# Patient Record
Sex: Male | Born: 1935 | Race: White | Hispanic: No | Smoking: Former smoker
Health system: Southern US, Community
[De-identification: ages and names within clinical notes are randomized; demographics above are authoritative.]

## PROBLEM LIST (undated history)

## (undated) DIAGNOSIS — M1990 Unspecified osteoarthritis, unspecified site: Secondary | ICD-10-CM

## (undated) DIAGNOSIS — M419 Scoliosis, unspecified: Secondary | ICD-10-CM

## (undated) DIAGNOSIS — I272 Pulmonary hypertension, unspecified: Secondary | ICD-10-CM

## (undated) DIAGNOSIS — I10 Essential (primary) hypertension: Secondary | ICD-10-CM

## (undated) DIAGNOSIS — M545 Low back pain, unspecified: Secondary | ICD-10-CM

## (undated) DIAGNOSIS — K649 Unspecified hemorrhoids: Secondary | ICD-10-CM

## (undated) DIAGNOSIS — C4491 Basal cell carcinoma of skin, unspecified: Secondary | ICD-10-CM

## (undated) DIAGNOSIS — G4733 Obstructive sleep apnea (adult) (pediatric): Secondary | ICD-10-CM

## (undated) DIAGNOSIS — IMO0002 Reserved for concepts with insufficient information to code with codable children: Secondary | ICD-10-CM

## (undated) DIAGNOSIS — R011 Cardiac murmur, unspecified: Secondary | ICD-10-CM

## (undated) DIAGNOSIS — I771 Stricture of artery: Secondary | ICD-10-CM

## (undated) DIAGNOSIS — Z9981 Dependence on supplemental oxygen: Secondary | ICD-10-CM

## (undated) DIAGNOSIS — I499 Cardiac arrhythmia, unspecified: Secondary | ICD-10-CM

## (undated) DIAGNOSIS — K5790 Diverticulosis of intestine, part unspecified, without perforation or abscess without bleeding: Secondary | ICD-10-CM

## (undated) DIAGNOSIS — I6523 Occlusion and stenosis of bilateral carotid arteries: Secondary | ICD-10-CM

## (undated) DIAGNOSIS — Z9989 Dependence on other enabling machines and devices: Secondary | ICD-10-CM

## (undated) DIAGNOSIS — G8929 Other chronic pain: Secondary | ICD-10-CM

## (undated) DIAGNOSIS — C439 Malignant melanoma of skin, unspecified: Secondary | ICD-10-CM

## (undated) DIAGNOSIS — R519 Headache, unspecified: Secondary | ICD-10-CM

## (undated) DIAGNOSIS — E785 Hyperlipidemia, unspecified: Secondary | ICD-10-CM

## (undated) DIAGNOSIS — G479 Sleep disorder, unspecified: Secondary | ICD-10-CM

## (undated) DIAGNOSIS — R51 Headache: Secondary | ICD-10-CM

## (undated) DIAGNOSIS — N400 Enlarged prostate without lower urinary tract symptoms: Secondary | ICD-10-CM

## (undated) HISTORY — PX: CERVICAL DISC SURGERY: SHX588

## (undated) HISTORY — PX: APPENDECTOMY: SHX54

## (undated) HISTORY — DX: Cardiac arrhythmia, unspecified: I49.9

## (undated) HISTORY — DX: Scoliosis, unspecified: M41.9

## (undated) HISTORY — DX: Unspecified hemorrhoids: K64.9

## (undated) HISTORY — DX: Essential (primary) hypertension: I10

## (undated) HISTORY — DX: Hyperlipidemia, unspecified: E78.5

## (undated) HISTORY — DX: Pulmonary hypertension, unspecified: I27.20

## (undated) HISTORY — DX: Sleep disorder, unspecified: G47.9

## (undated) HISTORY — PX: CATARACT EXTRACTION, BILATERAL: SHX1313

## (undated) HISTORY — PX: TONSILLECTOMY: SUR1361

## (undated) HISTORY — PX: INGUINAL HERNIA REPAIR: SUR1180

## (undated) HISTORY — DX: Benign prostatic hyperplasia without lower urinary tract symptoms: N40.0

## (undated) HISTORY — DX: Stricture of artery: I77.1

## (undated) HISTORY — DX: Occlusion and stenosis of bilateral carotid arteries: I65.23

## (undated) HISTORY — DX: Diverticulosis of intestine, part unspecified, without perforation or abscess without bleeding: K57.90

## (undated) HISTORY — PX: SKIN CANCER EXCISION: SHX779

## (undated) HISTORY — PX: BACK SURGERY: SHX140

---

## 1998-10-02 ENCOUNTER — Ambulatory Visit (HOSPITAL_COMMUNITY): Admission: RE | Admit: 1998-10-02 | Discharge: 1998-10-02 | Payer: Self-pay | Admitting: Neurosurgery

## 1998-10-03 ENCOUNTER — Encounter: Payer: Self-pay | Admitting: Neurosurgery

## 1998-10-12 ENCOUNTER — Encounter: Payer: Self-pay | Admitting: Neurosurgery

## 1998-10-14 ENCOUNTER — Inpatient Hospital Stay (HOSPITAL_COMMUNITY): Admission: RE | Admit: 1998-10-14 | Discharge: 1998-10-15 | Payer: Self-pay | Admitting: Neurosurgery

## 1998-10-14 ENCOUNTER — Encounter: Payer: Self-pay | Admitting: Neurosurgery

## 2000-04-14 ENCOUNTER — Emergency Department (HOSPITAL_COMMUNITY): Admission: EM | Admit: 2000-04-14 | Discharge: 2000-04-14 | Payer: Self-pay | Admitting: Emergency Medicine

## 2000-04-14 ENCOUNTER — Encounter: Payer: Self-pay | Admitting: Emergency Medicine

## 2002-02-18 ENCOUNTER — Encounter: Payer: Self-pay | Admitting: Critical Care Medicine

## 2002-02-18 ENCOUNTER — Ambulatory Visit (HOSPITAL_COMMUNITY): Admission: RE | Admit: 2002-02-18 | Discharge: 2002-02-18 | Payer: Self-pay | Admitting: Critical Care Medicine

## 2008-02-04 DIAGNOSIS — I6523 Occlusion and stenosis of bilateral carotid arteries: Secondary | ICD-10-CM

## 2008-02-04 HISTORY — DX: Occlusion and stenosis of bilateral carotid arteries: I65.23

## 2012-07-18 ENCOUNTER — Ambulatory Visit (INDEPENDENT_AMBULATORY_CARE_PROVIDER_SITE_OTHER): Payer: Self-pay | Admitting: General Surgery

## 2012-07-31 ENCOUNTER — Encounter (INDEPENDENT_AMBULATORY_CARE_PROVIDER_SITE_OTHER): Payer: Self-pay

## 2012-08-09 ENCOUNTER — Ambulatory Visit (INDEPENDENT_AMBULATORY_CARE_PROVIDER_SITE_OTHER): Payer: Medicare Other | Admitting: General Surgery

## 2012-08-21 ENCOUNTER — Ambulatory Visit (INDEPENDENT_AMBULATORY_CARE_PROVIDER_SITE_OTHER): Payer: Medicare Other | Admitting: General Surgery

## 2012-08-21 ENCOUNTER — Encounter (INDEPENDENT_AMBULATORY_CARE_PROVIDER_SITE_OTHER): Payer: Self-pay | Admitting: General Surgery

## 2012-08-21 VITALS — BP 124/64 | HR 70 | Temp 99.0°F | Resp 18 | Ht 66.0 in | Wt 150.2 lb

## 2012-08-21 NOTE — Progress Notes (Signed)
Chief Complaint  Patient presents with  . New Evaluation    eval hems    HISTORY: Pedro Hall is a 79 y.o. male who presents to the office with prolapsing hemorrhoids.  He states that he has to manually reduce them after BM's.  Other symptoms include minor bleeding.  This had been occurring for about a year.  He has tried nothing in the past. His bowel habits are regular and his bowel movements are usually soft.  His fiber intake is moderate.  His last colonoscopy was in 2007 and showed a diveticular striture.  He does have prolapsing tissue.     Past Medical History  Diagnosis Date  . Hypertension   . Stenosis of right subclavian artery   . BPH (benign prostatic hypertrophy)   . Arrhythmia   . Hyperlipidemia   . Diverticulosis   . Severe scoliosis   . Bilateral carotid artery stenosis 02/2008  . Hemorrhoids   . Sleep disorder   . Sleep apnea       History reviewed. No pertinent past surgical history.      Current Outpatient Prescriptions  Medication Sig Dispense Refill  . amLODipine (NORVASC) 2.5 MG tablet Take 2.5 mg by mouth daily.      Marland Kitchen aspirin 81 MG tablet Take 81 mg by mouth daily.      . Calcium Carbonate-Vit D-Min (GNP CALCIUM PLUS 600 +D) 600-200 MG-UNIT TABS Take 1 tablet by mouth daily.      . fish oil-omega-3 fatty acids 1000 MG capsule Take 1 g by mouth daily. With food      . MULTIPLE VITAMIN PO Take 1 tablet by mouth daily.      . vitamin C (ASCORBIC ACID) 500 MG tablet Take 500 mg by mouth daily.      Marland Kitchen latanoprost (XALATAN) 0.005 % ophthalmic solution       . MYRBETRIQ 25 MG TB24        No current facility-administered medications for this visit.      No Known Allergies    History reviewed. No pertinent family history.  History   Social History  . Marital Status: Widowed    Spouse Name: N/A    Number of Children: N/A  . Years of Education: N/A   Social History Main Topics  . Smoking status: Former Smoker    Types: Cigarettes    Quit  date: 06/05/1949  . Smokeless tobacco: Never Used  . Alcohol Use: 1.8 oz/week    3 Glasses of wine per week     Comment: Weekly.  . Drug Use: No  . Sexually Active: None   Other Topics Concern  . None   Social History Narrative  . None      REVIEW OF SYSTEMS - PERTINENT POSITIVES ONLY: Review of Systems - General ROS: negative for - chills, fever or weight loss Hematological and Lymphatic ROS: negative for - bleeding problems, blood clots or bruising Respiratory ROS: no cough, shortness of breath, or wheezing Cardiovascular ROS: no chest pain or dyspnea on exertion Gastrointestinal ROS: no abdominal pain, change in bowel habits, or black or bloody stools Genito-Urinary ROS: no dysuria, trouble voiding, or hematuria  EXAM: Filed Vitals:   08/21/12 0904  BP: 124/64  Pulse: 70  Temp: 99 F (37.2 C)  Resp: 18    General appearance: alert and cooperative Resp: clear to auscultation bilaterally Cardio: regular rate and rhythm GI: soft, non-tender; bowel sounds normal; no masses,  no organomegaly   Procedure:  Anoscopy Surgeon: Maisie Fus Diagnosis: Prolapsing hemorrhoids  Assistant: Christella Scheuermann After the risks and benefits were explained, verbal consent was obtained for above procedure  Anesthesia: none Findings: Grade 3 L anterior hemorrhoid, grade 2 right ant and post hemorrhoids, hard stool encountered in rectal vault    ASSESSMENT AND PLAN: Pedro Hall is a 79 y.o. M with prolapsing hemorrhoids that have to be manually reduced.  On exam it appears the L posterior hemorrhoid is the most pronounced.  He is already on a high fiber diet and gets symptoms even with flatus.  I believe that a hemorrhoidopexy would relieve his symptoms with minimal disruption to his daily routine.  The risks of the surgery are bleeding, infection, urinary retention and recurrence.  I believe he unsderstands this risks and has agreed to proceed.  We will get this scheduled in 3-4 weeks.  In the  meantime, he will work on increasing his liquid intake along with continuing his high fiber diet to help soften his BM's.    Vanita Panda, MD Colon and Rectal Surgery / General Surgery [facility]      Visit Diagnoses: 1. Hemorrhoids, internal     Primary Care Physician: Ginette Otto, MD

## 2012-08-21 NOTE — Patient Instructions (Signed)

## 2012-09-03 ENCOUNTER — Encounter (INDEPENDENT_AMBULATORY_CARE_PROVIDER_SITE_OTHER): Payer: Self-pay

## 2012-09-10 HISTORY — PX: HEMORRHOID SURGERY: SHX153

## 2012-09-11 DIAGNOSIS — K648 Other hemorrhoids: Secondary | ICD-10-CM

## 2012-09-13 ENCOUNTER — Telehealth (INDEPENDENT_AMBULATORY_CARE_PROVIDER_SITE_OTHER): Payer: Self-pay

## 2012-09-13 NOTE — Telephone Encounter (Signed)
Pt called wanting to know if a sitz bath is ok for pt to use instead of tub soaks and should he be putting any cream or ointment on rectum. I advised pt a warm water sitz bath is fine but to avoid creams or ointments until he has his first po appt. Pt states he understands. Pt to call with any concerns.

## 2012-09-25 ENCOUNTER — Encounter (INDEPENDENT_AMBULATORY_CARE_PROVIDER_SITE_OTHER): Payer: Medicare Other | Admitting: General Surgery

## 2012-10-10 ENCOUNTER — Encounter (INDEPENDENT_AMBULATORY_CARE_PROVIDER_SITE_OTHER): Payer: Self-pay | Admitting: General Surgery

## 2012-10-10 ENCOUNTER — Ambulatory Visit (INDEPENDENT_AMBULATORY_CARE_PROVIDER_SITE_OTHER): Payer: Medicare Other | Admitting: General Surgery

## 2012-10-10 VITALS — BP 164/84 | HR 74 | Temp 96.5°F | Ht 66.5 in | Wt 148.0 lb

## 2012-10-10 DIAGNOSIS — Z9889 Other specified postprocedural states: Secondary | ICD-10-CM

## 2012-10-10 NOTE — Patient Instructions (Addendum)
Follow up as needed

## 2012-10-10 NOTE — Progress Notes (Signed)
Pedro Hall is a 79 y.o. male who is status post a hemorrhoidopexy on 4/9.  He did well after surgery.  He had min pain and bleeding except for one episode about 2 weeks after surgery.  He had a large amt of blood with his BM after [place] train [place].  He has not had any problems since then.    Objective: Filed Vitals:   10/10/12 1627  BP: 164/84  Pulse: 74  Temp: 96.5 F (35.8 C)    General appearance: alert and cooperative  Incision: healing well, no significant drainage   Assessment: s/p  There are no active problems to display for this patient. mucopexy  Plan: RTO PRN    .Vanita Panda, MD [facility], [place] [phone]   10/10/2012 4:30 PM

## 2013-08-28 ENCOUNTER — Other Ambulatory Visit: Payer: Self-pay | Admitting: Geriatric Medicine

## 2013-08-28 ENCOUNTER — Ambulatory Visit
Admission: RE | Admit: 2013-08-28 | Discharge: 2013-08-28 | Disposition: A | Discharge status: Home / Self Care | Payer: Medicare Other | Source: Ambulatory Visit | Attending: Geriatric Medicine | Admitting: Geriatric Medicine

## 2013-08-28 DIAGNOSIS — E236 Other disorders of pituitary gland: Secondary | ICD-10-CM

## 2014-09-28 ENCOUNTER — Ambulatory Visit (INDEPENDENT_AMBULATORY_CARE_PROVIDER_SITE_OTHER): Payer: Medicare Other | Admitting: Family Medicine

## 2014-09-28 VITALS — BP 116/68 | HR 74 | Temp 98.3°F | Resp 17 | Ht 64.0 in | Wt 152.0 lb

## 2014-09-28 DIAGNOSIS — L03012 Cellulitis of left finger: Secondary | ICD-10-CM | POA: Diagnosis not present

## 2014-09-28 MED ORDER — AMOXICILLIN-POT CLAVULANATE 875-125 MG PO TABS: 1.0000 | ORAL_TABLET | Freq: Two times a day (BID) | ORAL | Status: DC

## 2014-09-28 NOTE — Patient Instructions (Signed)
Your last tetanus shot was in 2007. Your up-to-date. You need to take the antibiotic prescribed (it should be ready at your pharmacy) twice a day with food for the next 10 days. Please come back in one week so we can look at the finger again

## 2014-09-28 NOTE — Progress Notes (Signed)
Is an 81 year old retired gentleman who struck his left middle finger on the door when he lost his balance 10 days ago. He cut the finger but take cae of it himself. It has gotten progressively more swollen in the distal phalanx along with increasing pain. This been no discharge but he saw the nurse at the home or he stays and was advised to come here for further evaluation.  Objective:BP 116/68 mmHg  Pulse 74  Temp(Src) 98.3 F (36.8 C) (Oral)  Resp 17  Ht 5\' 4"  (1.626 m)  Wt 152 lb (68.947 kg)  BMI 26.08 kg/m2  SpO2 95%  Examination of the left middle finger reveals moderate swelling and erythema in the entire distal phalanx of the left middle finger with a dried laceration on the radial side the cuticle. He has full range of motion of the distal phalanx and there is no red streaking.   Assessment: Cellulitis following laceration and contusion of the left middle finger, distal phalanx  This chart was scribed in my presence and reviewed by me personally.    ICD-9-CM ICD-10-CM   1. Cellulitis of finger of left hand 681.00 L03.012 amoxicillin-clavulanate (AUGMENTIN) 875-125 MG per tablet     Signed, Robyn Haber, MD

## 2015-06-02 ENCOUNTER — Ambulatory Visit
Admission: RE | Admit: 2015-06-02 | Discharge: 2015-06-02 | Disposition: A | Discharge status: Home / Self Care | Payer: Medicare Other | Source: Ambulatory Visit | Attending: Geriatric Medicine | Admitting: Geriatric Medicine

## 2015-06-02 ENCOUNTER — Other Ambulatory Visit: Payer: Self-pay | Admitting: Geriatric Medicine

## 2015-06-02 DIAGNOSIS — R0600 Dyspnea, unspecified: Secondary | ICD-10-CM

## 2015-06-10 ENCOUNTER — Ambulatory Visit (INDEPENDENT_AMBULATORY_CARE_PROVIDER_SITE_OTHER): Payer: Medicare Other | Admitting: Cardiovascular Disease

## 2015-06-10 ENCOUNTER — Encounter: Payer: Self-pay | Admitting: Cardiovascular Disease

## 2015-06-10 VITALS — BP 136/76 | HR 79 | Ht 64.0 in | Wt 147.5 lb

## 2015-06-10 DIAGNOSIS — M419 Scoliosis, unspecified: Secondary | ICD-10-CM

## 2015-06-10 DIAGNOSIS — R0602 Shortness of breath: Secondary | ICD-10-CM

## 2015-06-10 DIAGNOSIS — I771 Stricture of artery: Secondary | ICD-10-CM

## 2015-06-10 DIAGNOSIS — I10 Essential (primary) hypertension: Secondary | ICD-10-CM | POA: Diagnosis not present

## 2015-06-10 DIAGNOSIS — I708 Atherosclerosis of other arteries: Secondary | ICD-10-CM

## 2015-06-10 DIAGNOSIS — E785 Hyperlipidemia, unspecified: Secondary | ICD-10-CM

## 2015-06-10 DIAGNOSIS — G4733 Obstructive sleep apnea (adult) (pediatric): Secondary | ICD-10-CM

## 2015-06-10 DIAGNOSIS — R079 Chest pain, unspecified: Secondary | ICD-10-CM

## 2015-06-10 NOTE — Patient Instructions (Signed)
Medication Instructions:  Please continue your current medications  Labwork: Your physician recommends that you return for lab work in at your earliest convenience.  Testing/Procedures: 1. Echocardiogram - Your physician has requested that you have an echocardiogram. Echocardiography is a painless test that uses sound waves to create images of your heart. It provides your doctor with information about the size and shape of your heart and how well your heart's chambers and valves are working. This procedure takes approximately one hour. There are no restrictions for this procedure.  2. Stress test - Your physician has requested that you have a lexiscan myoview. For further information please visit [email or web address]. Please follow instruction sheet, as given.  Follow-Up: Dr Oval Linsey recommends that you schedule a follow-up appointment in 3 months.  If you need a refill on your cardiac medications before your next appointment, please call your pharmacy.

## 2015-06-10 NOTE — Progress Notes (Signed)
Cardiology Office Note   Date:  06/14/2015   ID:  Pedro Hall, DOB 1923/08/02, MRN [id number]  PCP:  Mathews Argyle, MD  Cardiologist:   Sharol Harness, MD   Chief Complaint  Patient presents with  . New Evaluation    referred for shortness of breath with minimal exertion by dr Felipa Eth, otherwise, no complaints     History of Present Illness: Pedro Hall is a 82 y.o. male with hypertension, hyperlipidemia, bilateral carotid stenosis, and right subclavian stenosis who presents for an evaluation of shortness of breath.  Pedro Hall started noticing shortness of breath in December.  On Christmas day he was walking up a steep incline and had to stop and rest.  There was associated chest discomfort, nausea, vomiting or diaphoresis. He has not noticed any lower extremity edema, orthopnea or PND. He does have sleep apnea and uses his CPAP machine.  He has also noticed a nonproductive cough in the last couple of weeks. He denies any fevers or chills. However, it has been broken for the last month. Pedro Hall denies lightheadedness, dizziness, or palpitations. He saw Dr. Felipa Eth earlier this week and reported worsening shortness of breath.  Therefore, he was referred to cardiology for further evaluation.  Pedro Hall excercises daily at the fitness center for 35-45 minutes every morning.  He works out on [employer] and sometimes walks.  He denies dyspnea or chest pain or shortness of breath with this activity.  He used to walk 1 mile per day but has not been doing so lately.     Past Medical History  Diagnosis Date  . Hypertension   . Stenosis of right subclavian artery   . BPH (benign prostatic hypertrophy)   . Arrhythmia   . Hyperlipidemia   . Diverticulosis   . Severe scoliosis   . Bilateral carotid artery stenosis 02/2008  . Hemorrhoids   . Sleep disorder   . Sleep apnea   . Essential hypertension 06/14/2015  . Hyperlipidemia 06/14/2015    . Subclavian artery stenosis, right 06/14/2015  . Scoliosis 06/14/2015  . OSA (obstructive sleep apnea) 06/14/2015    Past Surgical History  Procedure Laterality Date  . Hemorrhoid surgery  09/10/12     Current Outpatient Prescriptions  Medication Sig Dispense Refill  . amLODipine (NORVASC) 2.5 MG tablet Take 2.5 mg by mouth daily.    Marland Kitchen amoxicillin-clavulanate (AUGMENTIN) 875-125 MG per tablet Take 1 tablet by mouth 2 (two) times daily. Take with food 20 tablet 0  . aspirin 81 MG tablet Take 81 mg by mouth daily.    . Calcium Carbonate-Vit D-Min (GNP CALCIUM PLUS 600 +D) 600-200 MG-UNIT TABS Take 1 tablet by mouth daily.    . fish oil-omega-3 fatty acids 1000 MG capsule Take 1 g by mouth daily. With food    . latanoprost (XALATAN) 0.005 % ophthalmic solution     . MULTIPLE VITAMIN PO Take 1 tablet by mouth daily.    Marland Kitchen MYRBETRIQ 25 MG TB24     . vitamin C (ASCORBIC ACID) 500 MG tablet Take 500 mg by mouth daily.     No current facility-administered medications for this visit.    Allergies:   Review of patient's allergies indicates no known allergies.    Social History:  The patient  reports that he quit smoking about 66 years ago. His smoking use included Cigarettes. He has never used smokeless tobacco. He reports that he drinks about 1.8 oz of alcohol per  week. He reports that he does not use illicit drugs.   Family History:  The patient's family history is not on file.    ROS:  Please see the history of present illness.   Otherwise, review of systems are positive for none.   All other systems are reviewed and negative.    PHYSICAL EXAM: VS:  BP 136/76 mmHg  Pulse 79  Ht 5\' 4"  (1.626 m)  Wt 66.906 kg (147 lb 8 oz)  BMI 25.31 kg/m2 , BMI Body mass index is 25.31 kg/(m^2). GENERAL:  Well appearing HEENT:  Pupils equal round and reactive, fundi not visualized, oral mucosa unremarkable NECK:  No jugular venous distention, waveform within normal limits, carotid upstroke brisk and  symmetric, no bruits, no thyromegaly LYMPHATICS:  No cervical adenopathy LUNGS:  Clear to auscultation bilaterally HEART:  RRR.  PMI not displaced or sustained,S1 and S2 within normal limits, no S3, no S4, no clicks, no rubs, III/VI mid-peaking crescendo-decrescendo murmur at the LUSB ABD:  Flat, positive bowel sounds normal in frequency in pitch, no bruits, no rebound, no guarding, no midline pulsatile mass, no hepatomegaly, no splenomegaly EXT:  2 plus pulses throughout, no edema, no cyanosis no clubbing SKIN:  No rashes no nodules NEURO:  Cranial nerves II through XII grossly intact, motor grossly intact throughout PSYCH:  Cognitively intact, oriented to person place and time    EKG:  EKG is ordered today. The ekg ordered today demonstrates sinus rhythm rate 79 bpm.  Peaked t waves   Recent Labs: 06/10/2015: BUN 13; Creat 0.57*; Potassium 5.0; Sodium 129*    Lipid Panel No results found for: CHOL, TRIG, HDL, CHOLHDL, VLDL, LDLCALC, LDLDIRECT    Wt Readings from Last 3 Encounters:  06/10/15 66.906 kg (147 lb 8 oz)  09/28/14 68.947 kg (152 lb)  10/10/12 67.132 kg (148 lb)      ASSESSMENT AND PLAN:  # Shortness of breath:  Pedro Hall does not have evidence of heart failure on exam, but he does have a systolic murmur concerning for aortic stenosis. It does not appear to be severe based on exam.  However, we will obtain an echo to evaluate for valvular heart disease.  We will also obtain a Lexiscan Cardiolite to evaluate for ischemia as the his dyspnea.  # Murmur: Concerning for aortic stenosis.  Echo as above.  # Peaked T waves: T waves are peaked on exam.  There are no prior EKGs to assess his potassium. for comparison. We will obtain a basic metabolic panel to assess his potassium.  # OSA: Pedro Hall was encouraged to either get a new CPAP or has his fixed so that he can resume using the machine.  Current medicines are reviewed at length with the patient today.  The  patient does not have concerns regarding medicines.  The following changes have been made:  no change  Labs/ tests ordered today include:   Orders Placed This Encounter  Procedures  . Basic metabolic panel  . Myocardial Perfusion Imaging  . EKG 12-Lead  . ECHOCARDIOGRAM COMPLETE     Disposition:   FU with Kemisha Bonnette C. Oval Linsey, MD, [facility] in 3 months   This note was written with the assistance of speech recognition software.  Please excuse any transcriptional errors.  Signed, Yecenia Dalgleish C. Oval Linsey, MD, [facility]  06/14/2015 1:08 PM    [facility]

## 2015-06-11 ENCOUNTER — Encounter: Payer: Self-pay | Admitting: Cardiovascular Disease

## 2015-06-11 LAB — BASIC METABOLIC PANEL
BUN: 13 mg/dL (ref 7–25)
CHLORIDE: 86 mmol/L — AB (ref 98–110)
CO2: 28 mmol/L (ref 20–31)
CREATININE: 0.57 mg/dL — AB (ref 0.70–1.11)
Calcium: 9.9 mg/dL (ref 8.6–10.3)
Glucose, Bld: 64 mg/dL — ABNORMAL LOW (ref 65–99)
Potassium: 5 mmol/L (ref 3.5–5.3)
Sodium: 129 mmol/L — ABNORMAL LOW (ref 135–146)

## 2015-06-14 ENCOUNTER — Encounter: Payer: Self-pay | Admitting: Cardiovascular Disease

## 2015-06-14 DIAGNOSIS — M419 Scoliosis, unspecified: Secondary | ICD-10-CM

## 2015-06-14 DIAGNOSIS — E785 Hyperlipidemia, unspecified: Secondary | ICD-10-CM

## 2015-06-14 DIAGNOSIS — I771 Stricture of artery: Secondary | ICD-10-CM

## 2015-06-14 DIAGNOSIS — I10 Essential (primary) hypertension: Secondary | ICD-10-CM

## 2015-06-14 DIAGNOSIS — G4733 Obstructive sleep apnea (adult) (pediatric): Secondary | ICD-10-CM | POA: Insufficient documentation

## 2015-06-14 HISTORY — DX: Scoliosis, unspecified: M41.9

## 2015-06-14 HISTORY — DX: Essential (primary) hypertension: I10

## 2015-06-14 HISTORY — DX: Stricture of artery: I77.1

## 2015-06-14 HISTORY — DX: Hyperlipidemia, unspecified: E78.5

## 2015-06-17 ENCOUNTER — Other Ambulatory Visit: Payer: Self-pay

## 2015-06-17 ENCOUNTER — Ambulatory Visit (HOSPITAL_COMMUNITY): Payer: Medicare Other | Attending: Cardiology

## 2015-06-17 DIAGNOSIS — E785 Hyperlipidemia, unspecified: Secondary | ICD-10-CM | POA: Diagnosis not present

## 2015-06-17 DIAGNOSIS — R079 Chest pain, unspecified: Secondary | ICD-10-CM | POA: Diagnosis not present

## 2015-06-17 DIAGNOSIS — I10 Essential (primary) hypertension: Secondary | ICD-10-CM | POA: Diagnosis not present

## 2015-06-17 DIAGNOSIS — I517 Cardiomegaly: Secondary | ICD-10-CM | POA: Diagnosis not present

## 2015-06-17 DIAGNOSIS — Z87891 Personal history of nicotine dependence: Secondary | ICD-10-CM | POA: Diagnosis not present

## 2015-06-17 DIAGNOSIS — R0602 Shortness of breath: Secondary | ICD-10-CM | POA: Insufficient documentation

## 2015-06-17 DIAGNOSIS — I059 Rheumatic mitral valve disease, unspecified: Secondary | ICD-10-CM | POA: Insufficient documentation

## 2015-06-17 DIAGNOSIS — R0600 Dyspnea, unspecified: Secondary | ICD-10-CM | POA: Insufficient documentation

## 2015-06-17 DIAGNOSIS — G4733 Obstructive sleep apnea (adult) (pediatric): Secondary | ICD-10-CM | POA: Insufficient documentation

## 2015-06-17 DIAGNOSIS — I071 Rheumatic tricuspid insufficiency: Secondary | ICD-10-CM | POA: Insufficient documentation

## 2015-06-17 DIAGNOSIS — I5189 Other ill-defined heart diseases: Secondary | ICD-10-CM | POA: Insufficient documentation

## 2015-06-17 NOTE — Progress Notes (Signed)
Patient ID: Pedro Hall, male   DOB: 1923-11-26, 82 y.o.   MRN: [id number]   The echo images are technically difficult due to chest wall deformity.  There is a left ventricular outflow gradient present with valsalva, and the left ventricle is hypertrophic.  Pedro Hall is stable and does not complain of any symptoms at the time of the exam.

## 2015-06-22 ENCOUNTER — Telehealth (HOSPITAL_COMMUNITY): Payer: Self-pay

## 2015-06-22 NOTE — Telephone Encounter (Signed)
Encounter complete. 

## 2015-06-24 ENCOUNTER — Ambulatory Visit (HOSPITAL_COMMUNITY)
Admission: RE | Admit: 2015-06-24 | Discharge: 2015-06-24 | Disposition: A | Discharge status: Home / Self Care | Payer: Medicare Other | Source: Ambulatory Visit | Attending: Cardiovascular Disease | Admitting: Cardiovascular Disease

## 2015-06-24 DIAGNOSIS — Z87891 Personal history of nicotine dependence: Secondary | ICD-10-CM | POA: Insufficient documentation

## 2015-06-24 DIAGNOSIS — R079 Chest pain, unspecified: Secondary | ICD-10-CM

## 2015-06-24 DIAGNOSIS — I10 Essential (primary) hypertension: Secondary | ICD-10-CM | POA: Diagnosis not present

## 2015-06-24 DIAGNOSIS — R0609 Other forms of dyspnea: Secondary | ICD-10-CM | POA: Diagnosis not present

## 2015-06-24 DIAGNOSIS — I739 Peripheral vascular disease, unspecified: Secondary | ICD-10-CM | POA: Diagnosis not present

## 2015-06-24 DIAGNOSIS — G4733 Obstructive sleep apnea (adult) (pediatric): Secondary | ICD-10-CM | POA: Insufficient documentation

## 2015-06-24 DIAGNOSIS — R9439 Abnormal result of other cardiovascular function study: Secondary | ICD-10-CM | POA: Diagnosis not present

## 2015-06-24 DIAGNOSIS — I779 Disorder of arteries and arterioles, unspecified: Secondary | ICD-10-CM | POA: Diagnosis not present

## 2015-06-24 DIAGNOSIS — R0602 Shortness of breath: Secondary | ICD-10-CM | POA: Diagnosis not present

## 2015-06-24 LAB — MYOCARDIAL PERFUSION IMAGING
CSEPPHR: 80 {beats}/min
NUC STRESS TID: 0.77
Rest HR: 71 {beats}/min

## 2015-06-24 MED ORDER — TECHNETIUM TC 99M SESTAMIBI GENERIC - CARDIOLITE
10.2000 | Freq: Once | INTRAVENOUS | Status: AC | PRN
  Administered 2015-06-24: 10.2 via INTRAVENOUS

## 2015-06-24 MED ORDER — TECHNETIUM TC 99M SESTAMIBI GENERIC - CARDIOLITE
30.8000 | Freq: Once | INTRAVENOUS | Status: AC | PRN
  Administered 2015-06-24: 30.8 via INTRAVENOUS

## 2015-06-24 MED ORDER — REGADENOSON 0.4 MG/5ML IV SOLN
0.4000 mg | Freq: Once | INTRAVENOUS | Status: AC
  Administered 2015-06-24: 0.4 mg via INTRAVENOUS

## 2015-06-25 ENCOUNTER — Telehealth: Payer: Self-pay | Admitting: *Deleted

## 2015-06-25 NOTE — Telephone Encounter (Signed)
Spoke to patient. Result given . Verbalized understanding Recall schedule

## 2015-06-25 NOTE — Telephone Encounter (Signed)
-----   Message from [name], MD sent at 06/24/2015  6:17 PM EST ----- Stress test shows an area where he may have had a heart attack in the past.  Otherwise, normal.

## 2015-06-28 ENCOUNTER — Telehealth: Payer: Self-pay | Admitting: *Deleted

## 2015-06-28 NOTE — Telephone Encounter (Signed)
-----   Message from [name], MD sent at 06/28/2015 12:12 AM EST ----- Echo is abnormal.  Please schedule follow up to discuss.

## 2015-06-28 NOTE — Telephone Encounter (Signed)
Spoke to patient. Result given . Verbalized understanding Schedule appointment for 07/16/15 at 10 am

## 2015-07-01 NOTE — Progress Notes (Signed)
Cardiology Office Note   Date:  07/04/2015   ID:  Pedro Leyden., DOB Jan 21, 1924, MRN [id number]  PCP:  Mathews Argyle, MD  Cardiologist:   Sharol Harness, MD   Chief Complaint  Patient presents with  . Follow-up  . Shortness of Breath     Patient ID: Pedro Hall. is a 82 y.o. male with hypertension, hyperlipidemia, bilateral carotid stenosis, and right subclavian stenosis who presents for an evaluation of shortness of breath.   Interval History 07/02/15:  After his last appointment Pedro Hall had an echo that showed normal systolic function and grade 1 diastolic dysfunction.  It also showed systolic anterior motion of the mitral valve but only mild focal basal hypertrophy of the septum.  It revealed an intracavitary gradient of 2 m/s and PASP 46 mmHg.  There was concern for a possible RA mass and TEE was recommended.  He typically doesn't have any problem with breathing.  However, when walking a long distance at a slight incline he notes some increased shortness of breath.  He denies chest pain or pressure.  He also has not noted any lower extremity edema, orthopnea or PND.  History of Present Illness 06/08/15:  Pedro Hall started noticing shortness of breath in December.  On Christmas day he was walking up a steep incline and had to stop and rest.  There was associated chest discomfort, nausea, vomiting or diaphoresis. He has not noticed any lower extremity edema, orthopnea or PND. He does have sleep apnea and uses his CPAP machine.  He has also noticed a nonproductive cough in the last couple of weeks. He denies any fevers or chills. However, it has been broken for the last month. Pedro Hall denies lightheadedness, dizziness, or palpitations. He saw Dr. Felipa Eth earlier this week and reported worsening shortness of breath.  Therefore, he was referred to cardiology for further evaluation.  Pedro Hall excercises daily at the fitness center for  35-45 minutes every morning.  He works out on [employer] and sometimes walks.  He denies dyspnea or chest pain or shortness of breath with this activity.  He used to walk 1 mile per day but has not been doing so lately.     Past Medical History  Diagnosis Date  . Hypertension   . Stenosis of right subclavian artery   . BPH (benign prostatic hypertrophy)   . Arrhythmia   . Hyperlipidemia   . Diverticulosis   . Severe scoliosis   . Bilateral carotid artery stenosis 02/2008  . Hemorrhoids   . Sleep disorder   . Sleep apnea   . Essential hypertension 06/14/2015  . Hyperlipidemia 06/14/2015  . Subclavian artery stenosis, right 06/14/2015  . Scoliosis 06/14/2015  . OSA (obstructive sleep apnea) 06/14/2015  . Pulmonary hypertension ([place]) 07/04/2015    Past Surgical History  Procedure Laterality Date  . Hemorrhoid surgery  09/10/12     Current Outpatient Prescriptions  Medication Sig Dispense Refill  . amLODipine (NORVASC) 2.5 MG tablet Take 2.5 mg by mouth daily.    Marland Kitchen amoxicillin-clavulanate (AUGMENTIN) 875-125 MG per tablet Take 1 tablet by mouth 2 (two) times daily. Take with food 20 tablet 0  . aspirin 81 MG tablet Take 81 mg by mouth daily.    . Calcium Carbonate-Vit D-Min (GNP CALCIUM PLUS 600 +D) 600-200 MG-UNIT TABS Take 1 tablet by mouth daily.    . fish oil-omega-3 fatty acids 1000 MG capsule Take 1 g by mouth daily. With  food    . latanoprost (XALATAN) 0.005 % ophthalmic solution     . MULTIPLE VITAMIN PO Take 1 tablet by mouth daily.    Marland Kitchen MYRBETRIQ 25 MG TB24     . vitamin C (ASCORBIC ACID) 500 MG tablet Take 500 mg by mouth daily.     No current facility-administered medications for this visit.    Allergies:   Review of patient's allergies indicates no known allergies.    Social History:  The patient  reports that he quit smoking about 66 years ago. His smoking use included Cigarettes. He has never used smokeless tobacco. He reports that he drinks about 1.8 oz of  alcohol per week. He reports that he does not use illicit drugs.   Family History:  The patient's family history is not on file.    ROS:  Please see the history of present illness.   Otherwise, review of systems are positive for none.   All other systems are reviewed and negative.    PHYSICAL EXAM: VS:  BP 126/72 mmHg  Pulse 78  Ht 5\' 9"  (1.753 m)  Wt 67.586 kg (149 lb)  BMI 21.99 kg/m2 , BMI Body mass index is 21.99 kg/(m^2). GENERAL:  Well appearing HEENT:  Pupils equal round and reactive, fundi not visualized, oral mucosa unremarkable NECK:  No jugular venous distention, waveform within normal limits, carotid upstroke brisk and symmetric, no bruits, no thyromegaly LYMPHATICS:  No cervical adenopathy LUNGS:  Clear to auscultation bilaterally HEART:  RRR.  PMI not displaced or sustained,S1 and S2 within normal limits, no S3, no S4, no clicks, no rubs, III/VI mid-peaking crescendo-decrescendo murmur at the LUSB ABD:  Flat, positive bowel sounds normal in frequency in pitch, no bruits, no rebound, no guarding, no midline pulsatile mass, no hepatomegaly, no splenomegaly EXT:  2 plus pulses throughout, no edema, no cyanosis no clubbing SKIN:  No rashes no nodules NEURO:  Cranial nerves II through XII grossly intact, motor grossly intact throughout PSYCH:  Cognitively intact, oriented to person place and time    EKG:  EKG is not ordered today.  Echo 06/17/15: Study Conclusions  - Left ventricle: The cavity size was normal. There was mild focal basal hypertrophy of the septum. Systolic function was vigorous. The estimated ejection fraction was in the range of 65% to 70%. Wall motion was normal; there were no regional wall motion abnormalities. Doppler parameters are consistent with abnormal left ventricular relaxation (grade 1 diastolic dysfunction). - Mitral valve: Calcified annulus. - Pulmonary arteries: Systolic pressure was moderately increased.  Impressions:  -  Technically difficult; vigorous LV function; grade 1 diastolic dysfunction; proximal septal thickening with chordal SAM; intracavitary gradient of 2 m/s; mild TR with moderately elevated pulmonary pressure; cannot R/O mass right atrium on short axis views; suggest TEE to further assess.  Recent Labs: 06/10/2015: BUN 13; Creat 0.57*; Potassium 5.0; Sodium 129*    Lipid Panel No results found for: CHOL, TRIG, HDL, CHOLHDL, VLDL, LDLCALC, LDLDIRECT    Wt Readings from Last 3 Encounters:  07/02/15 67.586 kg (149 lb)  06/24/15 66.679 kg (147 lb)  06/10/15 66.906 kg (147 lb 8 oz)      ASSESSMENT AND PLAN:   # Pulmonary hypertension: Pulmonary pressures were elevated on echo, which may be the cause of his exertional dyspnea.  Pedro Hall has known sleep apnea and has not been using his CPAP machine for quite a while because it is broken.  We discussed the importance of getting it fixed and  using it regularly.  This is likely the cause of his elevated pulmonary pressures.  We will also check a CMP and ANA to evaluate for liver disease and autoimmune disease as the cause of his symptoms.  He does not appear volume overloaded at this time, so we will not start a diuretic.  Given his age and relative lack of symptoms, we will not pursue RHC at this time.  If he does not improve with using his CPAP, we will consider RHC in the future.  His stress test was negative for ischemia and he does not have any known, significant left sided heart disease.  # Right atrial mass: It is unclear if there is a mass and if so, how it is affecting his symptoms.  Pedro Hall will get a TEE to better evaluate.  # OSA: Pedro Hall was encouraged to either get a new CPAP or has his fixed so that he can resume using the machine.  # Hypertension: Blood pressure well-controlled.  Continue amlodipine.  Current medicines are reviewed at length with the patient today.  The patient does not have concerns  regarding medicines.  The following changes have been made:  no change  Labs/ tests ordered today include:   Orders Placed This Encounter  Procedures  . Comprehensive metabolic panel  . ANA  . CBC  . ECHO TEE     Disposition:   FU with Pedro Knowlton C. Oval Linsey, MD, [facility] in 3 months   This note was written with the assistance of speech recognition software.  Please excuse any transcriptional errors.  Signed, Dyan Labarbera C. Oval Linsey, MD, [facility]  07/04/2015 4:35 PM    [place]

## 2015-07-02 ENCOUNTER — Encounter: Payer: Self-pay | Admitting: Cardiovascular Disease

## 2015-07-02 ENCOUNTER — Ambulatory Visit (INDEPENDENT_AMBULATORY_CARE_PROVIDER_SITE_OTHER): Payer: Medicare Other | Admitting: Cardiovascular Disease

## 2015-07-02 VITALS — BP 126/72 | HR 78 | Ht 69.0 in | Wt 149.0 lb

## 2015-07-02 DIAGNOSIS — G4730 Sleep apnea, unspecified: Secondary | ICD-10-CM | POA: Diagnosis not present

## 2015-07-02 DIAGNOSIS — R918 Other nonspecific abnormal finding of lung field: Secondary | ICD-10-CM

## 2015-07-02 DIAGNOSIS — I10 Essential (primary) hypertension: Secondary | ICD-10-CM

## 2015-07-02 DIAGNOSIS — I272 Other secondary pulmonary hypertension: Secondary | ICD-10-CM

## 2015-07-02 LAB — CBC
HEMATOCRIT: 44.4 % (ref 39.0–52.0)
HEMOGLOBIN: 15.3 g/dL (ref 13.0–17.0)
MCH: 31 pg (ref 26.0–34.0)
MCHC: 34.5 g/dL (ref 30.0–36.0)
MCV: 89.9 fL (ref 78.0–100.0)
MPV: 8.7 fL (ref 8.6–12.4)
Platelets: 208 10*3/uL (ref 150–400)
RBC: 4.94 MIL/uL (ref 4.22–5.81)
RDW: 14.8 % (ref 11.5–15.5)
WBC: 5.4 10*3/uL (ref 4.0–10.5)

## 2015-07-02 LAB — COMPREHENSIVE METABOLIC PANEL
ALT: 16 U/L (ref 9–46)
AST: 20 U/L (ref 10–35)
Albumin: 4 g/dL (ref 3.6–5.1)
Alkaline Phosphatase: 39 U/L — ABNORMAL LOW (ref 40–115)
BUN: 15 mg/dL (ref 7–25)
CHLORIDE: 87 mmol/L — AB (ref 98–110)
CO2: 30 mmol/L (ref 20–31)
Calcium: 9.1 mg/dL (ref 8.6–10.3)
Creat: 0.66 mg/dL — ABNORMAL LOW (ref 0.70–1.11)
GLUCOSE: 89 mg/dL (ref 65–99)
POTASSIUM: 4.8 mmol/L (ref 3.5–5.3)
Sodium: 128 mmol/L — ABNORMAL LOW (ref 135–146)
Total Bilirubin: 0.6 mg/dL (ref 0.2–1.2)
Total Protein: 6.7 g/dL (ref 6.1–8.1)

## 2015-07-02 NOTE — Patient Instructions (Signed)
LABS CBC ,CMP,ANA  KEEP APPOINTMENT IN September 27, 2015 WITH DR [facility]  SCHEDULE TEE WITH DR [place] IF POSSIBLE.   Transesophageal Echocardiogram Transesophageal echocardiography (TEE) is a special type of test that produces images of the heart by using sound waves (echocardiogram). This type of echocardiography can obtain better images of the heart than standard echocardiography. TEE is done by passing a flexible tube down the esophagus. The heart is located in front of the esophagus. Because the heart and esophagus are close to one another, your health care provider can take very clear, detailed pictures of the heart via ultrasound waves. TEE may be done:  If your health care provider needs more information based on standard echocardiography findings.  If you had a stroke. This might have happened because a clot formed in your heart. TEE can visualize different areas of the heart and check for clots.  To check valve anatomy and function.  To check for infection on the inside of your heart (endocarditis).  To evaluate the dividing wall (septum) of the heart and presence of a hole that did not close after birth (patent foramen ovale or atrial septal defect).  To help diagnose a tear in the wall of the aorta (aortic dissection).  During cardiac valve surgery. This allows the surgeon to assess the valve repair before closing the chest.  During a variety of other cardiac procedures to guide positioning of catheters.  Sometimes before a cardioversion, which is a shock to convert heart rhythm back to normal. LET [facility] CARE PROVIDER KNOW ABOUT:   Any allergies you have.  All medicines you are taking, including vitamins, herbs, eye drops, creams, and over-the-counter medicines.  Previous problems you or members of your family have had with the use of anesthetics.  Any blood disorders you have.  Previous surgeries you have had.  Medical conditions you have.  Swallowing  difficulties.  An esophageal obstruction. RISKS AND COMPLICATIONS  Generally, TEE is a safe procedure. However, as with any procedure, complications can occur. Possible complications include an esophageal tear (rupture). BEFORE THE PROCEDURE   Do not eat or drink for 6 hours before the procedure or as directed by your health care provider.  Arrange for someone to drive you home after the procedure. Do not drive yourself home. During the procedure, you will be given medicines that can continue to make you feel drowsy and can impair your reflexes.  An IV access tube will be started in the arm. PROCEDURE   A medicine to help you relax (sedative) will be given through the IV access tube.  A medicine may be sprayed or gargled to numb the back of the throat.  Your blood pressure, heart rate, and breathing (vital signs) will be monitored during the procedure.  The TEE probe is a long, flexible tube. The tip of the probe is placed into the back of the mouth, and you will be asked to swallow. This helps to pass the tip of the probe into the esophagus. Once the tip of the probe is in the correct area, your health care provider can take pictures of the heart.  TEE is usually not a painful procedure. You may feel the probe press against the back of the throat. The probe does not enter the trachea and does not affect your breathing. AFTER THE PROCEDURE   You will be in bed, resting, until you have fully returned to consciousness.  When you first awaken, your throat may feel slightly sore  and will probably still feel numb. This will improve slowly over time.  You will not be allowed to eat or drink until it is clear that the numbness has improved.  Once you have been able to drink, urinate, and sit on the edge of the bed without feeling sick to your stomach (nausea) or dizzy, you may be cleared to go home.  You should have a friend or family member with you for the next 24 hours after your  procedure.   This information is not intended to replace advice given to you by your health care provider. Make sure you discuss any questions you have with your health care provider.   Document Released: 08/12/2002 Document Revised: 05/27/2013 Document Reviewed: 11/21/2012 Elsevier Interactive Patient Education [employer].

## 2015-07-04 ENCOUNTER — Encounter: Payer: Self-pay | Admitting: Cardiovascular Disease

## 2015-07-04 DIAGNOSIS — I272 Pulmonary hypertension, unspecified: Secondary | ICD-10-CM

## 2015-07-04 HISTORY — DX: Pulmonary hypertension, unspecified: I27.20

## 2015-07-05 ENCOUNTER — Other Ambulatory Visit: Payer: Self-pay | Admitting: *Deleted

## 2015-07-05 DIAGNOSIS — I272 Pulmonary hypertension, unspecified: Secondary | ICD-10-CM

## 2015-07-05 DIAGNOSIS — R918 Other nonspecific abnormal finding of lung field: Secondary | ICD-10-CM

## 2015-07-05 DIAGNOSIS — Z01818 Encounter for other preprocedural examination: Secondary | ICD-10-CM

## 2015-07-06 ENCOUNTER — Telehealth: Payer: Self-pay | Admitting: Cardiovascular Disease

## 2015-07-06 LAB — ANTI-NUCLEAR AB-TITER (ANA TITER): ANA Titer 1: 1:40 {titer} — ABNORMAL HIGH

## 2015-07-06 LAB — ANA: ANA: POSITIVE — AB

## 2015-07-06 NOTE — Telephone Encounter (Signed)
Please call,wants to get more details about the endo test he is going to have.He also have some questions.

## 2015-07-06 NOTE — Telephone Encounter (Signed)
Returned call to patient.TEE instructions reviewed with patient.He verified understanding.Advised to call back if needed.

## 2015-07-13 ENCOUNTER — Encounter (HOSPITAL_COMMUNITY): Payer: Self-pay | Admitting: Cardiovascular Disease

## 2015-07-13 ENCOUNTER — Ambulatory Visit (HOSPITAL_COMMUNITY)
Admission: RE | Admit: 2015-07-13 | Discharge: 2015-07-13 | Disposition: A | Discharge status: Home / Self Care | Payer: Medicare Other | Source: Ambulatory Visit | Attending: Cardiovascular Disease | Admitting: Cardiovascular Disease

## 2015-07-13 ENCOUNTER — Encounter (HOSPITAL_COMMUNITY)
Admission: RE | Disposition: A | Discharge status: Home / Self Care | Payer: Self-pay | Source: Ambulatory Visit | Attending: Cardiovascular Disease

## 2015-07-13 ENCOUNTER — Ambulatory Visit (HOSPITAL_BASED_OUTPATIENT_CLINIC_OR_DEPARTMENT_OTHER)
Admission: RE | Admit: 2015-07-13 | Discharge: 2015-07-13 | Disposition: A | Discharge status: Home / Self Care | Payer: Medicare Other | Source: Ambulatory Visit | Attending: Cardiovascular Disease | Admitting: Cardiovascular Disease

## 2015-07-13 DIAGNOSIS — I700 Atherosclerosis of aorta: Secondary | ICD-10-CM | POA: Diagnosis not present

## 2015-07-13 DIAGNOSIS — R918 Other nonspecific abnormal finding of lung field: Secondary | ICD-10-CM

## 2015-07-13 DIAGNOSIS — Z87891 Personal history of nicotine dependence: Secondary | ICD-10-CM | POA: Diagnosis not present

## 2015-07-13 DIAGNOSIS — I270 Primary pulmonary hypertension: Secondary | ICD-10-CM

## 2015-07-13 DIAGNOSIS — Q211 Atrial septal defect: Secondary | ICD-10-CM | POA: Diagnosis not present

## 2015-07-13 DIAGNOSIS — I272 Other secondary pulmonary hypertension: Secondary | ICD-10-CM | POA: Insufficient documentation

## 2015-07-13 DIAGNOSIS — Z79899 Other long term (current) drug therapy: Secondary | ICD-10-CM | POA: Insufficient documentation

## 2015-07-13 DIAGNOSIS — I10 Essential (primary) hypertension: Secondary | ICD-10-CM | POA: Diagnosis not present

## 2015-07-13 DIAGNOSIS — Z7982 Long term (current) use of aspirin: Secondary | ICD-10-CM | POA: Diagnosis not present

## 2015-07-13 DIAGNOSIS — I340 Nonrheumatic mitral (valve) insufficiency: Secondary | ICD-10-CM | POA: Diagnosis not present

## 2015-07-13 DIAGNOSIS — G4733 Obstructive sleep apnea (adult) (pediatric): Secondary | ICD-10-CM | POA: Diagnosis not present

## 2015-07-13 DIAGNOSIS — R0602 Shortness of breath: Secondary | ICD-10-CM | POA: Diagnosis present

## 2015-07-13 DIAGNOSIS — E785 Hyperlipidemia, unspecified: Secondary | ICD-10-CM | POA: Diagnosis not present

## 2015-07-13 DIAGNOSIS — Z01818 Encounter for other preprocedural examination: Secondary | ICD-10-CM

## 2015-07-13 HISTORY — PX: TEE WITHOUT CARDIOVERSION: SHX5443

## 2015-07-13 SURGERY — ECHOCARDIOGRAM, TRANSESOPHAGEAL
Anesthesia: Moderate Sedation

## 2015-07-13 MED ORDER — FENTANYL CITRATE (PF) 100 MCG/2ML IJ SOLN
INTRAMUSCULAR | Status: AC
  Filled 2015-07-13: qty 2

## 2015-07-13 MED ORDER — SODIUM CHLORIDE 0.9 % IV SOLN
INTRAVENOUS | Status: DC
  Administered 2015-07-13: 10:00:00 via INTRAVENOUS

## 2015-07-13 MED ORDER — MIDAZOLAM HCL 5 MG/ML IJ SOLN
INTRAMUSCULAR | Status: AC
Start: 2015-07-13 — End: 2015-07-13
  Filled 2015-07-13: qty 2

## 2015-07-13 MED ORDER — MIDAZOLAM HCL 10 MG/2ML IJ SOLN
INTRAMUSCULAR | Status: DC | PRN
  Administered 2015-07-13: 1 mg via INTRAVENOUS
  Administered 2015-07-13: .5 mg via INTRAVENOUS
  Administered 2015-07-13: 1 mg via INTRAVENOUS

## 2015-07-13 MED ORDER — BUTAMBEN-TETRACAINE-BENZOCAINE 2-2-14 % EX AERO
INHALATION_SPRAY | CUTANEOUS | Status: DC | PRN
  Administered 2015-07-13: 2 via TOPICAL

## 2015-07-13 MED ORDER — DIPHENHYDRAMINE HCL 50 MG/ML IJ SOLN
INTRAMUSCULAR | Status: AC
  Filled 2015-07-13: qty 1

## 2015-07-13 MED ORDER — FENTANYL CITRATE (PF) 100 MCG/2ML IJ SOLN
INTRAMUSCULAR | Status: DC | PRN
  Administered 2015-07-13 (×2): 25 ug via INTRAVENOUS

## 2015-07-13 NOTE — H&P (View-Only) (Signed)
Cardiology Office Note   Date:  07/04/2015   ID:  Pedro Leyden., DOB 08-18-1923, MRN [id number]  PCP:  Mathews Argyle, MD  Cardiologist:   Sharol Harness, MD   Chief Complaint  Patient presents with  . Follow-up  . Shortness of Breath     Patient ID: Pedro Soy. is a 82 y.o. male with hypertension, hyperlipidemia, bilateral carotid stenosis, and right subclavian stenosis who presents for an evaluation of shortness of breath.   Interval History 07/02/15:  After his last appointment Pedro Hall had an echo that showed normal systolic function and grade 1 diastolic dysfunction.  It also showed systolic anterior motion of the mitral valve but only mild focal basal hypertrophy of the septum.  It revealed an intracavitary gradient of 2 m/s and PASP 46 mmHg.  There was concern for a possible RA mass and TEE was recommended.  He typically doesn't have any problem with breathing.  However, when walking a long distance at a slight incline he notes some increased shortness of breath.  He denies chest pain or pressure.  He also has not noted any lower extremity edema, orthopnea or PND.  History of Present Illness 06/08/15:  Pedro Hall started noticing shortness of breath in December.  On Christmas day he was walking up a steep incline and had to stop and rest.  There was associated chest discomfort, nausea, vomiting or diaphoresis. He has not noticed any lower extremity edema, orthopnea or PND. He does have sleep apnea and uses his CPAP machine.  He has also noticed a nonproductive cough in the last couple of weeks. He denies any fevers or chills. However, it has been broken for the last month. Pedro Hall denies lightheadedness, dizziness, or palpitations. He saw Dr. Felipa Eth earlier this week and reported worsening shortness of breath.  Therefore, he was referred to cardiology for further evaluation.  Pedro Hall excercises daily at the fitness center for  35-45 minutes every morning.  He works out on [employer] and sometimes walks.  He denies dyspnea or chest pain or shortness of breath with this activity.  He used to walk 1 mile per day but has not been doing so lately.     Past Medical History  Diagnosis Date  . Hypertension   . Stenosis of right subclavian artery   . BPH (benign prostatic hypertrophy)   . Arrhythmia   . Hyperlipidemia   . Diverticulosis   . Severe scoliosis   . Bilateral carotid artery stenosis 02/2008  . Hemorrhoids   . Sleep disorder   . Sleep apnea   . Essential hypertension 06/14/2015  . Hyperlipidemia 06/14/2015  . Subclavian artery stenosis, right 06/14/2015  . Scoliosis 06/14/2015  . OSA (obstructive sleep apnea) 06/14/2015  . Pulmonary hypertension ([place]) 07/04/2015    Past Surgical History  Procedure Laterality Date  . Hemorrhoid surgery  09/10/12     Current Outpatient Prescriptions  Medication Sig Dispense Refill  . amLODipine (NORVASC) 2.5 MG tablet Take 2.5 mg by mouth daily.    Marland Kitchen amoxicillin-clavulanate (AUGMENTIN) 875-125 MG per tablet Take 1 tablet by mouth 2 (two) times daily. Take with food 20 tablet 0  . aspirin 81 MG tablet Take 81 mg by mouth daily.    . Calcium Carbonate-Vit D-Min (GNP CALCIUM PLUS 600 +D) 600-200 MG-UNIT TABS Take 1 tablet by mouth daily.    . fish oil-omega-3 fatty acids 1000 MG capsule Take 1 g by mouth daily. With  food    . latanoprost (XALATAN) 0.005 % ophthalmic solution     . MULTIPLE VITAMIN PO Take 1 tablet by mouth daily.    Marland Kitchen MYRBETRIQ 25 MG TB24     . vitamin C (ASCORBIC ACID) 500 MG tablet Take 500 mg by mouth daily.     No current facility-administered medications for this visit.    Allergies:   Review of patient's allergies indicates no known allergies.    Social History:  The patient  reports that he quit smoking about 66 years ago. His smoking use included Cigarettes. He has never used smokeless tobacco. He reports that he drinks about 1.8 oz of  alcohol per week. He reports that he does not use illicit drugs.   Family History:  The patient's family history is not on file.    ROS:  Please see the history of present illness.   Otherwise, review of systems are positive for none.   All other systems are reviewed and negative.    PHYSICAL EXAM: VS:  BP 126/72 mmHg  Pulse 78  Ht 5\' 9"  (1.753 m)  Wt 67.586 kg (149 lb)  BMI 21.99 kg/m2 , BMI Body mass index is 21.99 kg/(m^2). GENERAL:  Well appearing HEENT:  Pupils equal round and reactive, fundi not visualized, oral mucosa unremarkable NECK:  No jugular venous distention, waveform within normal limits, carotid upstroke brisk and symmetric, no bruits, no thyromegaly LYMPHATICS:  No cervical adenopathy LUNGS:  Clear to auscultation bilaterally HEART:  RRR.  PMI not displaced or sustained,S1 and S2 within normal limits, no S3, no S4, no clicks, no rubs, III/VI mid-peaking crescendo-decrescendo murmur at the LUSB ABD:  Flat, positive bowel sounds normal in frequency in pitch, no bruits, no rebound, no guarding, no midline pulsatile mass, no hepatomegaly, no splenomegaly EXT:  2 plus pulses throughout, no edema, no cyanosis no clubbing SKIN:  No rashes no nodules NEURO:  Cranial nerves II through XII grossly intact, motor grossly intact throughout PSYCH:  Cognitively intact, oriented to person place and time    EKG:  EKG is not ordered today.  Echo 06/17/15: Study Conclusions  - Left ventricle: The cavity size was normal. There was mild focal basal hypertrophy of the septum. Systolic function was vigorous. The estimated ejection fraction was in the range of 65% to 70%. Wall motion was normal; there were no regional wall motion abnormalities. Doppler parameters are consistent with abnormal left ventricular relaxation (grade 1 diastolic dysfunction). - Mitral valve: Calcified annulus. - Pulmonary arteries: Systolic pressure was moderately increased.  Impressions:  -  Technically difficult; vigorous LV function; grade 1 diastolic dysfunction; proximal septal thickening with chordal SAM; intracavitary gradient of 2 m/s; mild TR with moderately elevated pulmonary pressure; cannot R/O mass right atrium on short axis views; suggest TEE to further assess.  Recent Labs: 06/10/2015: BUN 13; Creat 0.57*; Potassium 5.0; Sodium 129*    Lipid Panel No results found for: CHOL, TRIG, HDL, CHOLHDL, VLDL, LDLCALC, LDLDIRECT    Wt Readings from Last 3 Encounters:  07/02/15 67.586 kg (149 lb)  06/24/15 66.679 kg (147 lb)  06/10/15 66.906 kg (147 lb 8 oz)      ASSESSMENT AND PLAN:   # Pulmonary hypertension: Pulmonary pressures were elevated on echo, which may be the cause of his exertional dyspnea.  Pedro Hall has known sleep apnea and has not been using his CPAP machine for quite a while because it is broken.  We discussed the importance of getting it fixed and  using it regularly.  This is likely the cause of his elevated pulmonary pressures.  We will also check a CMP and ANA to evaluate for liver disease and autoimmune disease as the cause of his symptoms.  He does not appear volume overloaded at this time, so we will not start a diuretic.  Given his age and relative lack of symptoms, we will not pursue RHC at this time.  If he does not improve with using his CPAP, we will consider RHC in the future.  His stress test was negative for ischemia and he does not have any known, significant left sided heart disease.  # Right atrial mass: It is unclear if there is a mass and if so, how it is affecting his symptoms.  Pedro Hall will get a TEE to better evaluate.  # OSA: Pedro Hall was encouraged to either get a new CPAP or has his fixed so that he can resume using the machine.  # Hypertension: Blood pressure well-controlled.  Continue amlodipine.  Current medicines are reviewed at length with the patient today.  The patient does not have concerns  regarding medicines.  The following changes have been made:  no change  Labs/ tests ordered today include:   Orders Placed This Encounter  Procedures  . Comprehensive metabolic panel  . ANA  . CBC  . ECHO TEE     Disposition:   FU with Pedro Stella C. Oval Linsey, MD, [facility] in 3 months   This note was written with the assistance of speech recognition software.  Please excuse any transcriptional errors.  Signed, Zenab Gronewold C. Oval Linsey, MD, [facility]  07/04/2015 4:35 PM    [place]

## 2015-07-13 NOTE — Progress Notes (Signed)
[facility] here and set patient up with home O2. Pt was dc'd with no complaints and questions. SmondaYRN

## 2015-07-13 NOTE — Discharge Planning (Signed)
Fuller Mandril, RN, BSN, [place] [phone]. Pt qualifies for DME oxygen.  DME  ordered through [place].  Melene Muller of [facility] notified to deliver to pt room prior to D/C home.

## 2015-07-13 NOTE — Interval H&P Note (Signed)
History and Physical Interval Note:  07/13/2015 9:38 AM  Pedro Hall.  has presented today for surgery, with the diagnosis of PULMONARY HYPERTENSION   The various methods of treatment have been discussed with the patient and family. After consideration of risks, benefits and other options for treatment, the patient has consented to  Procedure(s): TRANSESOPHAGEAL ECHOCARDIOGRAM (TEE) (N/A) as a surgical intervention .  The patient's history has been reviewed, patient examined, no change in status, stable for surgery.  I have reviewed the patient's chart and labs.  Questions were answered to the patient's satisfaction.     Steffen Hase C. Oval Linsey, MD, [facility]  07/13/2015  9:38 AM

## 2015-07-13 NOTE — CV Procedure (Signed)
Brief TEE Note  Moderate Sedation: 2 mg Versed, 50 mcg Fentanyl  LVEF >55% Moderate TR.  Trivial MR Mildly calcified aortic valve without restriction. Severe pulmonary hypertension.  PFO noted with right to left flow.  There was a mobile target in the right atrium most consistent with a prominent Chiari network.  Lipomatous hypertrophy of the intra-atrial septum.  Morrissa Shein C. Oval Linsey, MD, [facility]  07/13/2015 11:42 AM

## 2015-07-13 NOTE — Discharge Instructions (Signed)
Transesophageal Echocardiogram Transesophageal echocardiography (TEE) is a picture test of your heart using sound waves. The pictures taken can give very detailed pictures of your heart. This can help your doctor see if there are problems with your heart. TEE can check:  If your heart has blood clots in it.  How well your heart valves are working.  If you have an infection on the inside of your heart.  Some of the major arteries of your heart.  If your heart valve is working after a Office manager.  Your heart before a procedure that uses a shock to your heart to get the rhythm back to normal. BEFORE THE PROCEDURE  Do not eat or drink for 6 hours before the procedure or as told by your doctor.  Make plans to have someone drive you home after the procedure. Do not drive yourself home.  Moderate Conscious Sedation, Adult, Care After Refer to this sheet in the next few weeks. These instructions provide you with information on caring for yourself after your procedure. Your health care provider may also give you more specific instructions. Your treatment has been planned according to current medical practices, but problems sometimes occur. Call your health care provider if you have any problems or questions after your procedure. WHAT TO EXPECT AFTER THE PROCEDURE  After your procedure:  You may feel sleepy, clumsy, and have poor balance for several hours.  Vomiting may occur if you eat too soon after the procedure. HOME CARE INSTRUCTIONS  Do not participate in any activities where you could become injured for at least 24 hours. Do not:  Drive.  Swim.  Ride a bicycle.  Operate heavy machinery.  Cook.  Use power tools.  Climb ladders.  Work from a high place.  Do not make important decisions or sign legal documents until you are improved.  If you vomit, drink water, juice, or soup when you can drink without vomiting. Make sure you have little or no nausea before eating solid  foods.  Only take over-the-counter or prescription medicines for pain, discomfort, or fever as directed by your health care provider.  Make sure you and your family fully understand everything about the medicines given to you, including what side effects may occur.  You should not drink alcohol, take sleeping pills, or take medicines that cause drowsiness for at least 24 hours.  If you smoke, do not smoke without supervision.  If you are feeling better, you may resume normal activities 24 hours after you were sedated.  Keep all appointments with your health care provider. SEEK MEDICAL CARE IF:  Your skin is pale or bluish in color.  You continue to feel nauseous or vomit.  Your pain is getting worse and is not helped by medicine.  You have bleeding or swelling.  You are still sleepy or feeling clumsy after 24 hours. SEEK IMMEDIATE MEDICAL CARE IF:  You develop a rash.  You have difficulty breathing.  You develop any type of allergic problem.  You have a fever. MAKE SURE YOU:  Understand these instructions.  Will watch your condition.  Will get help right away if you are not doing well or get worse.   This information is not intended to replace advice given to you by your health care provider. Make sure you discuss any questions you have with your health care provider.   Document Released: 03/12/2013 Document Revised: 06/12/2014 Document Reviewed: 03/12/2013 Elsevier Interactive Patient Education [employer].       An  IV tube will be put in your arm. PROCEDURE  You will be given a medicine to help you relax (sedative). It will be given through the IV tube.  A numbing medicine will be sprayed or gargled in the back of your throat to help numb it.  The tip of the probe is placed into the back of your mouth. You will be asked to swallow. This helps to pass the probe into your esophagus.  Once the tip of the probe is in the right place, your doctor can  take pictures of your heart.  You may feel pressure at the back of your throat. AFTER THE PROCEDURE  You will be taken to a recovery area so the sedative can wear off.  Your throat may be sore and scratchy. This will go away slowly over time.  You will go home when you are fully awake and able to swallow liquids.  You should have someone stay with you for the next 24 hours.  Do not drive or operate machinery for the next 24 hours.   This information is not intended to replace advice given to you by your health care provider. Make sure you discuss any questions you have with your health care provider.   Document Released: 03/19/2009 Document Revised: 05/27/2013 Document Reviewed: 11/21/2012 Elsevier Interactive Patient Education [employer].

## 2015-07-13 NOTE — Progress Notes (Signed)
SATURATION QUALIFICATIONS: (This note is used to comply with regulatory documentation for home oxygen)  Patient Saturations on Room Air at Rest = 76%  Patient Saturations on Room Air while Ambulating = 88%  Patient Saturations on 3 Liters of oxygen while Ambulating = 95%  Please briefly explain why patient needs home oxygen:  Pt came in for procedure and O2 sats on Room Air were 88%; placed on 3L [place] and saturation went to 94%; procedure performed and unable to wean off of oxygen post procedure; Dr Oval Linsey given order to go home on oxygen

## 2015-07-13 NOTE — Progress Notes (Signed)
Pts O2 sat 85-87 on room air. Dr. Oval Linsey notified. Pre preprocedure O2 sats were 89 on Room Air. Pt walked at the bedside with sats still 87, then patient gotten back to bed with sats dropping to 77. Pt placed back on O2 at [facility]. Sats back up to 92-93. Dr., Oval Linsey notified. SMonday RN

## 2015-07-13 NOTE — Progress Notes (Signed)
  Echocardiogram Echocardiogram Transesophageal has been performed.  Bobbye Charleston 07/13/2015, 11:53 AM

## 2015-07-14 ENCOUNTER — Telehealth: Payer: Self-pay | Admitting: *Deleted

## 2015-07-14 ENCOUNTER — Encounter (HOSPITAL_COMMUNITY): Payer: Self-pay | Admitting: Cardiovascular Disease

## 2015-07-14 NOTE — Telephone Encounter (Signed)
Per Melene Muller of [facility],  pt refused delivery of oxygen at home.  Pt signed [facility] AMA waiver stating he would not accept oxygen; feels he doesn't need it anymore.

## 2015-07-16 ENCOUNTER — Ambulatory Visit: Payer: Medicare Other | Admitting: Cardiovascular Disease

## 2015-07-24 NOTE — Progress Notes (Signed)
u   Cardiology Office Note   Date:  07/26/2015   ID:  Pedro Orrell., DOB 1923-07-04, MRN [id number]  PCP:  Mathews Argyle, Pedro Hall  Cardiologist:   Sharol Harness, Pedro Hall   Chief Complaint  Patient presents with  . Follow-up    no chest pain, no shortness of breath, no swelling, no cramping, no dizziness or lightheadedness     Patient ID: Pedro Hall. is a 82 y.o. male with hypertension, hyperlipidemia, bilateral carotid stenosis, and right subclavian stenosis who presents for an evaluation of shortness of breath.   Interval History 07/26/15: After his last appointment Pedro Hall underwent TEE that revealed no RA mass, but rather a prominent Chiari network.  It did reveal severe pulmonary hypertension with PASP at least 78 mmHg.  After the procedure he required supplemental oxygen and was discharged home on 2L.  He otherwise tolerated the procedure well.  He was referred for evaluation by Pedro Hall for pulmonary hypertension.  Since the procedure he Has been feeling well. He continues to exercise at the fitness center most days of the week. He has not noticed any limitations there than back pain due to scoliosis. He denies any chest pain or shortness of breath. He did have a CPAP fixed and was wearing a for a short period of time. However it is now broken again. Pedro Hall s accompanied by his son. He notes that he has actually been feeling short of breath for over a year now. After his transesophageal echo he use the oxygen tanks that he went home with but has not used it anymore.  He has not noted any lower extremity edema, orthopnea, or PND.  Interval History 07/02/15:  After his last appointment Pedro Hall had an echo that showed normal systolic function and grade 1 diastolic dysfunction.  It also showed systolic anterior motion of the mitral valve but only mild focal basal hypertrophy of the septum.  It revealed an intracavitary gradient of 2 m/s  and PASP 46 mmHg.  There was concern for a possible RA mass and TEE was recommended.  He typically doesn't have any problem with breathing.  However, when walking a long distance at a slight incline he notes some increased shortness of breath.  He denies chest pain or pressure.  He also has not noted any lower extremity edema, orthopnea or PND.  History of Present Illness 06/08/15:  Pedro Hall started noticing shortness of breath in December.  On Christmas day he was walking up a steep incline and had to stop and rest.  There was associated chest discomfort, nausea, vomiting or diaphoresis. He has not noticed any lower extremity edema, orthopnea or PND. He does have sleep apnea and uses his CPAP machine.  He has also noticed a nonproductive cough in the last couple of weeks. He denies any fevers or chills. However, it has been broken for the last month. Pedro Hall denies lightheadedness, dizziness, or palpitations. He saw Pedro Hall earlier this week and reported worsening shortness of breath.  Therefore, he was referred to cardiology for further evaluation.  Pedro Hall excercises daily at the fitness center for 35-45 minutes every morning.  He works out on [employer] and sometimes walks.  He denies dyspnea or chest pain or shortness of breath with this activity.  He used to walk 1 mile per day but has not been doing so lately.     Past Medical History  Diagnosis Date  .  Hypertension   . Stenosis of right subclavian artery   . BPH (benign prostatic hypertrophy)   . Arrhythmia   . Hyperlipidemia   . Diverticulosis   . Severe scoliosis   . Bilateral carotid artery stenosis 02/2008  . Hemorrhoids   . Sleep disorder   . Sleep apnea   . Essential hypertension 06/14/2015  . Hyperlipidemia 06/14/2015  . Subclavian artery stenosis, right 06/14/2015  . Scoliosis 06/14/2015  . OSA (obstructive sleep apnea) 06/14/2015  . Pulmonary hypertension ([place]) 07/04/2015    Past Surgical History   Procedure Laterality Date  . Hemorrhoid surgery  09/10/12  . Tee without cardioversion N/A 07/13/2015    Procedure: TRANSESOPHAGEAL ECHOCARDIOGRAM (TEE);  Surgeon: Pedro Latch, Pedro Hall;  Location: [facility] ENDOSCOPY;  Service: Cardiovascular;  Laterality: N/A;     Current Outpatient Prescriptions  Medication Sig Dispense Refill  . acetaminophen (TYLENOL) 500 MG tablet Take 500 mg by mouth every 6 (six) hours as needed for mild pain.    Marland Kitchen amLODipine (NORVASC) 5 MG tablet 5 mg once.  0  . aspirin 81 MG tablet Take 81 mg by mouth daily.    . brimonidine-timolol (COMBIGAN) 0.2-0.5 % ophthalmic solution Place 1 drop into both eyes every 12 (twelve) hours.    . Calcium Carbonate-Vit D-Min (GNP CALCIUM PLUS 600 +D) 600-200 MG-UNIT TABS Take 1 tablet by mouth daily.    . fish oil-omega-3 fatty acids 1000 MG capsule Take 1 g by mouth daily. With food    . latanoprost (XALATAN) 0.005 % ophthalmic solution Place 1 drop into both eyes at bedtime.     Marland Kitchen losartan (COZAAR) 100 MG tablet 100 mg once.  11  . MULTIPLE VITAMIN PO Take 1 tablet by mouth daily.    Marland Kitchen MYRBETRIQ 25 MG TB24 Take 25 mg by mouth daily.     Marland Kitchen triamcinolone (KENALOG) 0.025 % cream Apply 1 application topically daily.   0  . vitamin C (ASCORBIC ACID) 500 MG tablet Take 500 mg by mouth daily.     No current facility-administered medications for this visit.    Allergies:   Review of patient's allergies indicates no known allergies.    Social History:  The patient  reports that he quit smoking about 66 years ago. His smoking use included Cigarettes. He has never used smokeless tobacco. He reports that he drinks about 1.8 oz of alcohol per week. He reports that he does not use illicit drugs.   Family History:  The patient's family history is not on file.    ROS:  Please see the history of present illness.   Otherwise, review of systems are positive for none.   All other systems are reviewed and negative.    PHYSICAL EXAM: VS:  BP 130/70  mmHg  Pulse 64  Ht 5\' 9"  (1.753 m)  Wt 67.132 kg (148 lb)  BMI 21.85 kg/m2  SpO2 93% , BMI Body mass index is 21.85 kg/(m^2). GENERAL:  Well appearing HEENT:  Pupils equal round and reactive, fundi not visualized, oral mucosa unremarkable NECK:  No jugular venous distention, waveform within normal limits, carotid upstroke brisk and symmetric, no bruits LYMPHATICS:  No cervical adenopathy LUNGS:  Clear to auscultation bilaterally HEART:  RRR.  PMI not displaced or sustained,S1 and S2 within normal limits, no S3, no S4, no clicks, no rubs, III/VI mid-peaking crescendo-decrescendo murmur at the LUSB ABD:  Flat, positive bowel sounds normal in frequency in pitch, no bruits, no rebound, no guarding, no midline pulsatile mass, no  hepatomegaly, no splenomegaly EXT:  2 plus pulses throughout, no edema, no cyanosis no clubbing SKIN:  No rashes no nodules NEURO:  Cranial nerves II through XII grossly intact, motor grossly intact throughout PSYCH:  Cognitively intact, oriented to person place and time   EKG:  EKG is not ordered today.  Echo 06/17/15: Study Conclusions  - Left ventricle: The cavity size was normal. There was mild focal basal hypertrophy of the septum. Systolic function was vigorous. The estimated ejection fraction was in the range of 65% to 70%. Wall motion was normal; there were no regional wall motion abnormalities. Doppler parameters are consistent with abnormal left ventricular relaxation (grade 1 diastolic dysfunction). - Mitral valve: Calcified annulus. - Pulmonary arteries: Systolic pressure was moderately increased.  Impressions:  - Technically difficult; vigorous LV function; grade 1 diastolic dysfunction; proximal septal thickening with chordal SAM; intracavitary gradient of 2 m/s; mild TR with moderately elevated pulmonary pressure; cannot R/O mass right atrium on short axis views; suggest TEE to further assess.  TEE 07/13/15: Study  Conclusions  - Left ventricle: Systolic function was normal. Wall motion was normal; there were no regional wall motion abnormalities. - Aortic valve: There was trivial regurgitation. - Mitral valve: There was mild regurgitation. - Left atrium: No evidence of thrombus in the atrial cavity or appendage. No evidence of thrombus in the atrial cavity or appendage. - Right atrium: No evidence of thrombus or mass in the atrial cavity or appendage. - Atrial septum: There was a patent foramen ovale with right to left flow at rest and on saline microcavitation study.  Impressions:  - Severe pulmonary hypertenstion. PASP is at least 78 mmHg.   Recent Labs: 07/02/2015: ALT 16; BUN 15; Creat 0.66*; Hemoglobin 15.3; Platelets 208; Potassium 4.8; Sodium 128*    Lipid Panel No results found for: CHOL, TRIG, HDL, CHOLHDL, VLDL, LDLCALC, LDLDIRECT    Wt Readings from Last 3 Encounters:  07/26/15 67.132 kg (148 lb)  07/02/15 67.586 kg (149 lb)  06/24/15 66.679 kg (147 lb)      ASSESSMENT AND PLAN:   # Pulmonary hypertension: Pulmonary pressures are extremely elevated.  Mr. Branton's O2 saturation was 91% at rest and decreased to 84% with walking.  He is working on getting his CPAP machine fixed.  We ordered supplemental oxygen with exertion.  We have also referred him for V/Q scan and PFTs.  The rest of his work up has been unremarkable.  (ANA mildly elevated at 1:40, CBC normal, sodium 128 on BMP).  He is doing better with the addition of lasix and denies symptoms.  However, it also seems like he is limiting his activity to avoid symptoms.   We will refer him to Dr. Aundra Dubin for further evaluation and consideration of Ingram therapy.  # Right atrial mass: Chiari network on TEE.  No masses.  # OSA: Mr. Shellman was encouraged to either get a new CPAP or has his fixed so that he can resume using the machine.  # Hypertension: Blood pressure well-controlled.  Continue  amlodipine.  Current medicines are reviewed at length with the patient today.  The patient does not have concerns regarding medicines.  The following changes have been made:  no change  Labs/ tests ordered today include:   Orders Placed This Encounter  Procedures  . NM Pulmonary Perf and Vent  . DG Chest 2 View  . Ambulatory referral to Cardiology  . Pulmonary Function Test    Time spent: 45 minutes-Greater than 50% of this  time was spent in counseling, explanation of diagnosis, planning of further management, and coordination of care.   Disposition:   FU with [place] C. Oval Linsey, Pedro Hall, [facility] in April.   This note was written with the assistance of speech recognition software.  Please excuse any transcriptional errors.  Signed, Desmen Schoffstall C. Oval Linsey, Pedro Hall, [facility]  07/26/2015 5:49 PM    [place]

## 2015-07-26 ENCOUNTER — Encounter: Payer: Self-pay | Admitting: Cardiovascular Disease

## 2015-07-26 ENCOUNTER — Ambulatory Visit (INDEPENDENT_AMBULATORY_CARE_PROVIDER_SITE_OTHER): Payer: Medicare Other | Admitting: Cardiovascular Disease

## 2015-07-26 ENCOUNTER — Telehealth: Payer: Self-pay | Admitting: Cardiovascular Disease

## 2015-07-26 VITALS — BP 130/70 | HR 64 | Ht 69.0 in | Wt 148.0 lb

## 2015-07-26 DIAGNOSIS — I272 Other secondary pulmonary hypertension: Secondary | ICD-10-CM | POA: Diagnosis not present

## 2015-07-26 DIAGNOSIS — I10 Essential (primary) hypertension: Secondary | ICD-10-CM

## 2015-07-26 DIAGNOSIS — G4733 Obstructive sleep apnea (adult) (pediatric): Secondary | ICD-10-CM | POA: Diagnosis not present

## 2015-07-26 NOTE — Telephone Encounter (Signed)
Spoke with pt  He is aware of where he is supposed to go tomorrow, for his test.  Pt verbalized understanding of where and when to arrive,  No questions at this time.

## 2015-07-26 NOTE — Telephone Encounter (Signed)
Called patient and notified him that his testing is ordered to be done at [facility] 2/21 Patient voiced understanding

## 2015-07-26 NOTE — Telephone Encounter (Signed)
New message  Pt recently had an appt today with Dr. Oval Linsey. Pt was scheduled for  DG Chest and a NM PULMONARY VENT AND PERF  For 07/27/2015 pt req a call back to determine what the appts are for and where he is supposed to go. Please call

## 2015-07-26 NOTE — Patient Instructions (Signed)
Your physician has recommended that you have a pulmonary function test. Pulmonary Function Tests are a group of tests that measure how well air moves in and out of your lungs.  .You have been referred to DR [facility] -AFTER PULMONARY FUNCTION TEST , AND VQ SCAN  KEEP APPOINTMENT WITH September 27 2015 WITH DR [facility].   WILL CONTACT [facility] TO SET UP FOR PORTABLE OXYGEN.  Ventilation-Perfusion Scan A ventilation-perfusion scan is a scan to look at the airflow (ventilation) and blood flow (perfusion) in your lungs. It is most often used to look for blood clots that may have traveled to your lungs. During this scan, radioactive compounds are injected into your body or are breathed in (inhale). These radioactive compounds are detected by a special camera during the scan, are given at very low doses, are not harmful to you, and last in your body for a very short time.  LET [facility] CARE PROVIDER KNOW ABOUT:  Any allergies you have.  All medicines you are taking, including vitamins, herbs, eye drops, creams, and over-the-counter medicines.  Any blood disorders you have.  Previous surgeries you have had.  Medical conditions you have.  Possibility of pregnancy, if this applies.  Breastfeeding, if this applies. RISKS AND COMPLICATIONS Generally, this is a safe procedure. However, as with any procedure, complications can occur. A possible complication includes having an allergic reaction to the radioactive compounds.  BEFORE THE PROCEDURE  Do not smoke before your test.  Take medicine as directed by your health care provider.   PROCEDURE  A small needle will be placed in a vein in your arm or hand. This needle will stay in place for the entire exam.  A small amount of very short-acting radioactive material will be injected.  Your lungs will then be scanned using a special camera. This camera will record the images.  You will be asked to inhale a second radioactive compound.  After this, the lungs are scanned again. AFTER THE PROCEDURE  You may go home unless your health care provider instructs you differently.  You may continue with normal activities and diet as instructed by your health care provider.   This information is not intended to replace advice given to you by your health care provider. Make sure you discuss any questions you have with your health care provider.   Document Released: 05/19/2000 Document Revised: 06/12/2014 Document Reviewed: 12/05/2012 Elsevier Interactive Patient Education [employer].

## 2015-07-27 ENCOUNTER — Telehealth: Payer: Self-pay | Admitting: *Deleted

## 2015-07-27 ENCOUNTER — Ambulatory Visit (HOSPITAL_COMMUNITY)
Admission: RE | Admit: 2015-07-27 | Discharge: 2015-07-27 | Disposition: A | Discharge status: Home / Self Care | Payer: Medicare Other | Source: Ambulatory Visit | Attending: Cardiovascular Disease | Admitting: Cardiovascular Disease

## 2015-07-27 DIAGNOSIS — I272 Other secondary pulmonary hypertension: Secondary | ICD-10-CM | POA: Diagnosis not present

## 2015-07-27 DIAGNOSIS — R918 Other nonspecific abnormal finding of lung field: Secondary | ICD-10-CM | POA: Diagnosis not present

## 2015-07-27 MED ORDER — TECHNETIUM TO 99M ALBUMIN AGGREGATED
4.0000 | Freq: Once | INTRAVENOUS | Status: AC | PRN
  Administered 2015-07-27: 4 via INTRAVENOUS

## 2015-07-27 MED ORDER — TECHNETIUM TC 99M DIETHYLENETRIAME-PENTAACETIC ACID: 30.0000 | Freq: Once | INTRAVENOUS | Status: DC | PRN

## 2015-07-27 NOTE — Telephone Encounter (Signed)
CALLED SPOKE TO REPRESENTATIVE LILLIE  PER ORDER FROM DR [place] , RN WAS CALLING TO HAVE A PORTABLE OXYGEN TANK SENT TO PATIENT ,IN ADDITION TO WHAT PATIENT HAS NOW. O2 SAT WERE DONE IN OFFICE 07/26/15 DURING OFFICE VISIT. PATIENT HAD STATED THE OXYGEN TANK CANNISTER WAS TO CUMBERSOME TO WALK. HE REQUESTED A SMALL TANK - ( [place])  LILLIE ( REPRESENTATIVE ) STATES PATIENT DOES NOT HAVE EQUIPMENT BECAUSE REFUSED THE DAY OF DELIVERY.  PATIENT WANTED MORE DETAIL WHY HE NEEDED OXYGEN. RN INFORMED LILLIE , PATIENT STILL HAS EQUIPMENT  AT HIS HOME.  REQUESTING RE- EDUCATION TO PATIENT AND SON  ON OXYGEN SUGGEST PATIENT'S SON CONTACTING LILLIE AND SETTING UP APPOINTMENT TIME. EXTENSION # FOR LILLIE  # [id number]. RN WILL CALL SON AND GIVE HIM THE NUMBER.

## 2015-07-27 NOTE — Telephone Encounter (Signed)
RN SPOKE TO SON  INFORMED HIM OF THE CONVERSATION WITH [facility] THE PHONE NUMBER AND EXTENSION  WAS GIVEN  TERRY (SON) WILL CONTACT LILLIE. TERRY STATES HE WILL GO BY PATIENT'S HOME TO SEE WHAT EXACTLY WHAT IS LEFT AT PATIENT'S HOME. TERRY WILL CALL [facility] TOMORROW. ANY QUESTION MAY CALL BACK .SON  VERBALIZED UNDERSTANDING.

## 2015-07-28 ENCOUNTER — Telehealth: Payer: Self-pay | Admitting: *Deleted

## 2015-07-28 NOTE — Telephone Encounter (Signed)
Spoke to patient. Result given . Verbalized understanding  

## 2015-07-28 NOTE — Telephone Encounter (Signed)
Left message to call  spoke to [place]  ( [facility] eariler - in regards to patient receiving  portable oxygen tank  to carry with activities Pedro Hall stated he has to contact patient to see what equipment patient has an what order is needed.

## 2015-07-28 NOTE — Telephone Encounter (Signed)
Returning your call. °

## 2015-07-28 NOTE — Telephone Encounter (Signed)
-----   Message from [name], MD sent at 07/27/2015  6:03 PM EST ----- No pulmonary embolism.

## 2015-08-23 ENCOUNTER — Ambulatory Visit (HOSPITAL_COMMUNITY)
Admission: RE | Admit: 2015-08-23 | Discharge: 2015-08-23 | Disposition: A | Discharge status: Home / Self Care | Payer: Medicare Other | Source: Ambulatory Visit | Attending: Cardiology | Admitting: Cardiology

## 2015-08-23 ENCOUNTER — Encounter (HOSPITAL_COMMUNITY): Payer: Self-pay

## 2015-08-23 VITALS — BP 140/90 | HR 83 | Wt 149.1 lb

## 2015-08-23 DIAGNOSIS — G4733 Obstructive sleep apnea (adult) (pediatric): Secondary | ICD-10-CM | POA: Insufficient documentation

## 2015-08-23 DIAGNOSIS — Z7982 Long term (current) use of aspirin: Secondary | ICD-10-CM | POA: Insufficient documentation

## 2015-08-23 DIAGNOSIS — Z87891 Personal history of nicotine dependence: Secondary | ICD-10-CM | POA: Insufficient documentation

## 2015-08-23 DIAGNOSIS — Z9981 Dependence on supplemental oxygen: Secondary | ICD-10-CM | POA: Diagnosis not present

## 2015-08-23 DIAGNOSIS — I110 Hypertensive heart disease with heart failure: Secondary | ICD-10-CM | POA: Diagnosis not present

## 2015-08-23 DIAGNOSIS — I5032 Chronic diastolic (congestive) heart failure: Secondary | ICD-10-CM | POA: Diagnosis not present

## 2015-08-23 DIAGNOSIS — I272 Other secondary pulmonary hypertension: Secondary | ICD-10-CM | POA: Diagnosis present

## 2015-08-23 DIAGNOSIS — R011 Cardiac murmur, unspecified: Secondary | ICD-10-CM | POA: Insufficient documentation

## 2015-08-23 DIAGNOSIS — E785 Hyperlipidemia, unspecified: Secondary | ICD-10-CM | POA: Insufficient documentation

## 2015-08-23 DIAGNOSIS — Z79899 Other long term (current) drug therapy: Secondary | ICD-10-CM | POA: Insufficient documentation

## 2015-08-23 DIAGNOSIS — I10 Essential (primary) hypertension: Secondary | ICD-10-CM | POA: Diagnosis not present

## 2015-08-23 LAB — BASIC METABOLIC PANEL
Anion gap: 13 (ref 5–15)
BUN: 14 mg/dL (ref 6–20)
CALCIUM: 9.3 mg/dL (ref 8.9–10.3)
CO2: 25 mmol/L (ref 22–32)
CREATININE: 0.63 mg/dL (ref 0.61–1.24)
Chloride: 90 mmol/L — ABNORMAL LOW (ref 101–111)
GFR calc Af Amer: >60 mL/min (ref >60)
GLUCOSE: 88 mg/dL (ref 65–99)
Potassium: 4.7 mmol/L (ref 3.5–5.1)
Sodium: 128 mmol/L — ABNORMAL LOW (ref 135–145)

## 2015-08-23 LAB — BRAIN NATRIURETIC PEPTIDE: B Natriuretic Peptide: 132.8 pg/mL — ABNORMAL HIGH (ref 0.0–100.0)

## 2015-08-23 MED ORDER — CARVEDILOL 6.25 MG PO TABS: 6.2500 mg | ORAL_TABLET | Freq: Two times a day (BID) | ORAL | Status: DC

## 2015-08-23 MED ORDER — FUROSEMIDE 20 MG PO TABS: 20.0000 mg | ORAL_TABLET | Freq: Every day | ORAL | Status: AC

## 2015-08-23 NOTE — Patient Instructions (Signed)
Start Carvedilol 6.25 mg Twice daily   Start Furosemide (Lasix) 20 mg daily, can stop if breathing is not better  Labs today  Use home oxygen with activity  Your physician recommends that you schedule a follow-up appointment in: 2-3 weeks

## 2015-08-23 NOTE — Progress Notes (Signed)
Patient ID: Pedro Hall., male   DOB: February 23, 1924, 82 y.o.   MRN: [id number]    Advanced Heart Failure Clinic Note   Primary Care: Dr Felipa Eth Primary Cardiologist: Dr Oval Linsey HF Cardiology: Dr. Aundra Dubin  HPI: Pedro Hall. is a 82 y.o. male with diastolic CHF, hypertension, hyperlipidemia, bilateral carotid stenosis, and right subclavian stenosis who was referred for evaluation of pulmonary hypertension.   Echo 07/02/15 LVEF 65-70%, basal septal hypertrophy, Grade 1 DD.   TEE 07/13/15 with prominent Chiari network in right atrium and severe pulmonary HTN with PASP at least 78 mmHg.   V/Q scan (2/17) showed no PE.   PFTs scheduled but not yet done. RHC has been defered so far with age and relative lack of symptoms.   He presents today to establish in CHF clinic.  Overall, he feels OK. He has DOE when walking for long distance or up an incline, this has been noticeable since 12/16. Has had CPAP for ~2 years, and uses it most of the time, more regularly now. He used to walk 1-1.5 miles daily, but now going to fitness center.  Works out 35-40 minutes every morning with weights machines and Nu-step.  Quit smoking 60 years ago. At his most was smoking about 1 ppd for 5-6 years. Denies heavy alcohol use. Still snores very badly if doesn't wear CPAP. Denies lightheadedness or dizziness. He is noted to be hypoxemic with exertion.    Labs (1/17): K 4.8, creatinine 0.66, ANA + (1:40)  Review of Systems: All systems reviewed and negative except as per HPI.   PMH:  1. Chronic diastolic CHF/pulmonary hypertension:  - Echo 07/02/15 LVEF 65-70%, focal basal septal hypertrophy with chordal SAM and mild LV outflow tract gradient, Grade 1 DD.  - TEE 07/13/15 with prominent Chiari network in right atrium and severe pulmonary HTN with PASP at least 78 mmHg. - CXR 07/27/15 Chronic elevation versus eventration of the left hemidiaphragm. Low lung volumes bilaterally.  - V/Q scan 07/27/15: No  evidence of PE. Heterogenous ventilation suggests COPD - Cardiolite (1/17) with small apical anterior fixed defect, no ischemia (low risk).  2. OSA: uses CPAP 3. HTN 4. Hyperlipidemia 5. Right Atrial Mass: Determined to be chiari network on TEE 6. Right subclavian stenosis.   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 Lives here in [place]. Quit smoking > 60 yrs ago.  Wife has passed away.  Lives alone. Has 2 sons and 2 step children (boy and a girl). All live in [place], and fairly close by.   Family History - No family history of cardiac or lung disease that he knows of.   Current Outpatient Prescriptions  Medication Sig Dispense Refill  . acetaminophen (TYLENOL) 500 MG tablet Take 500 mg by mouth every 6 (six) hours as needed for mild pain.    Marland Kitchen amLODipine (NORVASC) 5 MG tablet 5 mg once.  0  . aspirin 81 MG tablet Take 81 mg by mouth daily.    . brimonidine-timolol (COMBIGAN) 0.2-0.5 % ophthalmic solution Place 1 drop into both eyes every 12 (twelve) hours.    . Calcium Carbonate-Vit D-Min (GNP CALCIUM PLUS 600 +D) 600-200 MG-UNIT TABS Take 1 tablet by mouth daily.    . fish oil-omega-3 fatty acids 1000 MG capsule Take 1 g by mouth daily. With food    . latanoprost (XALATAN) 0.005 % ophthalmic solution Place 1 drop into both eyes at bedtime.     Marland Kitchen  losartan (COZAAR) 100 MG tablet 100 mg once.  11  . MULTIPLE VITAMIN PO Take 1 tablet by mouth daily.    Marland Kitchen MYRBETRIQ 25 MG TB24 Take 25 mg by mouth daily.     Marland Kitchen triamcinolone (KENALOG) 0.025 % cream Apply 1 application topically daily.   0  . vitamin C (ASCORBIC ACID) 500 MG tablet Take 500 mg by mouth daily.     No current facility-administered medications for this encounter.    No Known Allergies   Filed Vitals:   08/23/15 1126  BP: 140/90  Pulse: 83  Weight: 149 lb 1.9 oz (67.64 kg)  SpO2: 80%    PHYSICAL EXAM: General:  Elderly appearing. No respiratory difficulty.  Ambulated into clinic without difficulty. HEENT: normal Neck: supple. JVP 8 cm. Carotids 2+ bilat; No thyromegaly or nodule noted. Cor: PMI nondisplaced. RRR, no G/R.  2/6 SEM RUSB, S2 heard clearly.  Lungs: Slight crackles at bases bilaterally.  Abdomen: soft, NT, ND, no HSM. No bruits or masses. +BS  Extremities: no cyanosis, clubbing, rash.  1+ ankle edema.  Neuro: alert & oriented x 3, cranial nerves grossly intact. moves all 4 extremities w/o difficulty. Affect pleasant.  ASSESSMENT & PLAN:  1. Chronic diastolic CHF: Patient has NYHA class II symptoms.  Normal EF by echo.  TEE in 2/17 showed severely elevated PA pressure (PASP 78 mmHg). This could be pulmonary venous hypertension in the setting of diastolic CHF.  He has a murmur on exam and focal basal septal hypertrophy with chordal SAM and a mild LV outflow tract gradient.  Lowering this gradient could potentially help his symptoms.  - Add Coreg 6.25 mg bid => BP is high, and this will also decrease LV contractility and possibly reduce LV outflow tract gradient.  - He is not markedly volume overloaded on exam, but I will give him a trial of Lasix 20 mg daily to see if this helps his breathing.  BMET in 1 week. - Check BNP today.  2. OSA: Encouraged to continue wearing CPAP nightly. 3. HTN:  BP high.  Starting Coreg as above.  4. Pulmonary hypertension: Noted by echo.  This may be pulmonary venous hypertension (group 2 PH) due to diastolic CHF versus group 3 PH due to intrinsic lung disease.  I think that group 1 PH is less likely.  He is hypoxic with exertion and has oxygen at home, this suggests possible parenchymal lung disease.  He smoked very little in the past so COPD is unlikely.  I am concerned about possible pulmonary fibrosis.  Of note, V/Q scan was recently normal.  - Arrange PFTs.  - Would hold off on RHC for now.  - Wear oxygen with activity.   Satira Mccallum Tillery PA-C 08/23/2015   Patient seen with PA, agree with the  above note.  See the above discussions about diastolic CHF and pulmonary hypertension.  Will add Coreg 6.25 mg bid and Lasix 20 mg daily.  He will need to wear oxygen with exertion.  Would hold off on RHC for now, await PFTs (?pulmonary fibrosis as cause of hypoxemia).   Loralie Champagne 08/23/2015

## 2015-08-25 ENCOUNTER — Ambulatory Visit (INDEPENDENT_AMBULATORY_CARE_PROVIDER_SITE_OTHER): Payer: Medicare Other | Admitting: Internal Medicine

## 2015-08-25 DIAGNOSIS — I272 Other secondary pulmonary hypertension: Secondary | ICD-10-CM | POA: Diagnosis not present

## 2015-08-25 LAB — PULMONARY FUNCTION TEST
DL/VA % pred: 94 %
DL/VA: 3.73 ml/min/mmHg/L
DLCO COR % PRED: 44 %
DLCO COR: 9.63 ml/min/mmHg
DLCO UNC: 9.29 ml/min/mmHg
DLCO unc % pred: 43 %
FEF 25-75 POST: 0.69 L/s
FEF 25-75 Pre: 0.44 L/sec
FEF2575-%Change-Post: 57 %
FEF2575-%Pred-Post: 88 %
FEF2575-%Pred-Pre: 56 %
FEV1-%CHANGE-POST: 11 %
FEV1-%PRED-POST: 70 %
FEV1-%Pred-Pre: 63 %
FEV1-POST: 1.05 L
FEV1-Pre: 0.94 L
FEV1FVC-%Change-Post: 8 %
FEV1FVC-%PRED-PRE: 91 %
FEV6-%Change-Post: 3 %
FEV6-%PRED-POST: 73 %
FEV6-%PRED-PRE: 71 %
FEV6-POST: 1.52 L
FEV6-Pre: 1.47 L
FEV6FVC-%CHANGE-POST: 0 %
FEV6FVC-%PRED-POST: 111 %
FEV6FVC-%Pred-Pre: 110 %
FVC-%CHANGE-POST: 2 %
FVC-%Pred-Post: 66 %
FVC-%Pred-Pre: 64 %
FVC-Post: 1.53 L
FVC-Pre: 1.49 L
POST FEV6/FVC RATIO: 99 %
PRE FEV1/FVC RATIO: 63 %
PRE FEV6/FVC RATIO: 99 %
Post FEV1/FVC ratio: 69 %

## 2015-08-25 NOTE — Progress Notes (Signed)
PFT done today. 

## 2015-08-31 ENCOUNTER — Telehealth: Payer: Self-pay | Admitting: *Deleted

## 2015-08-31 DIAGNOSIS — J449 Chronic obstructive pulmonary disease, unspecified: Secondary | ICD-10-CM

## 2015-08-31 NOTE — Telephone Encounter (Signed)
Advised patient and referral placed for pulmonary

## 2015-08-31 NOTE — Telephone Encounter (Signed)
-----   Message from [name], MD sent at 08/28/2015  6:58 PM EDT ----- Pulmonary function tests are abnormal.  Keep appointment with Dr. Aundra Dubin.  Schedule follow up with pulmonology for pulmonary hypertension and moderate obstructive lung disease.  Refer to Dr. [place] Bells.

## 2015-09-14 ENCOUNTER — Encounter (HOSPITAL_COMMUNITY): Payer: Medicare Other | Admitting: Internal Medicine

## 2015-09-20 ENCOUNTER — Encounter (HOSPITAL_COMMUNITY): Payer: Medicare Other | Admitting: Internal Medicine

## 2015-09-21 ENCOUNTER — Encounter (HOSPITAL_COMMUNITY): Payer: Self-pay

## 2015-09-21 ENCOUNTER — Ambulatory Visit (HOSPITAL_COMMUNITY)
Admission: RE | Admit: 2015-09-21 | Discharge: 2015-09-21 | Disposition: A | Discharge status: Home / Self Care | Payer: Medicare Other | Source: Ambulatory Visit | Attending: Internal Medicine | Admitting: Internal Medicine

## 2015-09-21 VITALS — BP 125/65 | HR 80 | Resp 22 | Wt 145.3 lb

## 2015-09-21 DIAGNOSIS — Z87891 Personal history of nicotine dependence: Secondary | ICD-10-CM | POA: Diagnosis not present

## 2015-09-21 DIAGNOSIS — I5032 Chronic diastolic (congestive) heart failure: Secondary | ICD-10-CM | POA: Insufficient documentation

## 2015-09-21 DIAGNOSIS — R0902 Hypoxemia: Secondary | ICD-10-CM | POA: Diagnosis not present

## 2015-09-21 DIAGNOSIS — E785 Hyperlipidemia, unspecified: Secondary | ICD-10-CM | POA: Diagnosis not present

## 2015-09-21 DIAGNOSIS — G4733 Obstructive sleep apnea (adult) (pediatric): Secondary | ICD-10-CM | POA: Insufficient documentation

## 2015-09-21 DIAGNOSIS — Z79899 Other long term (current) drug therapy: Secondary | ICD-10-CM | POA: Insufficient documentation

## 2015-09-21 DIAGNOSIS — J449 Chronic obstructive pulmonary disease, unspecified: Secondary | ICD-10-CM | POA: Insufficient documentation

## 2015-09-21 DIAGNOSIS — I272 Other secondary pulmonary hypertension: Secondary | ICD-10-CM | POA: Insufficient documentation

## 2015-09-21 DIAGNOSIS — Z7982 Long term (current) use of aspirin: Secondary | ICD-10-CM | POA: Diagnosis not present

## 2015-09-21 DIAGNOSIS — Z9981 Dependence on supplemental oxygen: Secondary | ICD-10-CM | POA: Insufficient documentation

## 2015-09-21 DIAGNOSIS — I110 Hypertensive heart disease with heart failure: Secondary | ICD-10-CM | POA: Insufficient documentation

## 2015-09-21 LAB — BASIC METABOLIC PANEL
ANION GAP: 10 (ref 5–15)
BUN: 21 mg/dL — ABNORMAL HIGH (ref 6–20)
CALCIUM: 9.1 mg/dL (ref 8.9–10.3)
CO2: 29 mmol/L (ref 22–32)
Chloride: 92 mmol/L — ABNORMAL LOW (ref 101–111)
Creatinine, Ser: 0.76 mg/dL (ref 0.61–1.24)
Glucose, Bld: 99 mg/dL (ref 65–99)
Potassium: 4.4 mmol/L (ref 3.5–5.1)
SODIUM: 131 mmol/L — AB (ref 135–145)

## 2015-09-21 LAB — BRAIN NATRIURETIC PEPTIDE: B Natriuretic Peptide: 171 pg/mL — ABNORMAL HIGH (ref 0.0–100.0)

## 2015-09-21 NOTE — Patient Instructions (Signed)
Labs today  Your physician recommends that you schedule a follow-up appointment in: 3 months  

## 2015-09-22 NOTE — Progress Notes (Signed)
Patient ID: Pedro Hall., male   DOB: 1924/03/06, 82 y.o.   MRN: [id number]    Advanced Heart Failure Clinic Note   Primary Care: Dr Felipa Eth Primary Cardiologist: Dr Oval Linsey HF Cardiology: Dr. Aundra Dubin  HPI: Pedro Hall. is a 82 y.o. male with diastolic CHF, hypertension, hyperlipidemia, bilateral carotid stenosis, and right subclavian stenosis who was referred for evaluation of pulmonary hypertension.   Echo 07/02/15 LVEF 65-70%, basal septal hypertrophy, Grade 1 DD.   TEE 07/13/15 with prominent Chiari network in right atrium and severe pulmonary HTN with PASP at least 78 mmHg.   V/Q scan (2/17) showed no PE.   PFTs in 3/17 with a moderate obstructive defect.   RHC has been defered so far with age and relative lack of symptoms.   He uses CPAP nightly.   At last appointment, he was started on Coreg and Lasix.  He thinks he breathing may be a bit better.  He is short of breath if he walks fast or walks up stairs.  Does fine walking a slow/steady pace.  No chest pain.  No orthopnea/PND.  No lightheadedness or palpitations.   Weight is down 4 lbs.   Labs (1/17): K 4.8, creatinine 0.66, ANA + (1:40) Labs (3/17): K 4.7, creatinine 0.63, BNP 133  Review of Systems: All systems reviewed and negative except as per HPI.   PMH:  1. Chronic diastolic CHF/pulmonary hypertension:  - Echo 07/02/15 LVEF 65-70%, focal basal septal hypertrophy with chordal SAM and mild LV outflow tract gradient, Grade 1 DD.  - TEE 07/13/15 with prominent Chiari network in right atrium and severe pulmonary HTN with PASP at least 78 mmHg. - CXR 07/27/15 Chronic elevation versus eventration of the left hemidiaphragm. Low lung volumes bilaterally.  - V/Q scan 07/27/15: No evidence of PE. Heterogenous ventilation suggests COPD - Cardiolite (1/17) with small apical anterior fixed defect, no ischemia (low risk).  2. OSA: uses CPAP 3. HTN 4. Hyperlipidemia 5. Right Atrial Mass: Determined to be chiari  network on TEE 6. Right subclavian stenosis.  7. COPD: Not a heavy smoker in the past, but PFTs in 2/17 showed a moderate obstructive defect with severely decreased DLCO.   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 Lives here in [place]. Quit smoking > 60 yrs ago.  Wife has passed away.  Lives alone. Has 2 sons and 2 step children (boy and a girl). All live in [place], and fairly close by.   Family History - No family history of cardiac or lung disease that he knows of.   Current Outpatient Prescriptions  Medication Sig Dispense Refill  . acetaminophen (TYLENOL) 500 MG tablet Take 500 mg by mouth every 6 (six) hours as needed for mild pain.    Marland Kitchen amLODipine (NORVASC) 5 MG tablet 5 mg once.  0  . aspirin 81 MG tablet Take 81 mg by mouth daily.    . brimonidine-timolol (COMBIGAN) 0.2-0.5 % ophthalmic solution Place 1 drop into both eyes every 12 (twelve) hours.    . Calcium Carbonate-Vit D-Min (GNP CALCIUM PLUS 600 +D) 600-200 MG-UNIT TABS Take 1 tablet by mouth daily.    . carvedilol (COREG) 6.25 MG tablet Take 1 tablet (6.25 mg total) by mouth 2 (two) times daily. 60 tablet 3  . fish oil-omega-3 fatty acids 1000 MG capsule Take 1 g by mouth daily. With food    . furosemide (LASIX) 20 MG tablet Take 1 tablet (20  mg total) by mouth daily. 30 tablet 3  . latanoprost (XALATAN) 0.005 % ophthalmic solution Place 1 drop into both eyes at bedtime.     Marland Kitchen losartan (COZAAR) 100 MG tablet 100 mg once.  11  . MULTIPLE VITAMIN PO Take 1 tablet by mouth daily.    Marland Kitchen MYRBETRIQ 25 MG TB24 Take 25 mg by mouth daily.     Marland Kitchen triamcinolone (KENALOG) 0.025 % cream Apply 1 application topically daily.   0  . vitamin C (ASCORBIC ACID) 500 MG tablet Take 500 mg by mouth daily.     No current facility-administered medications for this encounter.    No Known Allergies   Filed Vitals:   09/21/15 1427  BP: 125/65  Pulse: 80  Resp: 22  Weight: 145 lb 5 oz  (65.913 kg)  SpO2: 90%    PHYSICAL EXAM: General:  Elderly appearing. No respiratory difficulty. Ambulated into clinic without difficulty. HEENT: normal Neck: supple. JVP 7 cm. Carotids 2+ bilat; No thyromegaly or nodule noted. Cor: PMI nondisplaced. RRR, no G/R.  2/6 SEM RUSB, S2 heard clearly.  Lungs: Slight crackles at bases bilaterally.  Abdomen: soft, NT, ND, no HSM. No bruits or masses. +BS  Extremities: no cyanosis, clubbing, rash.  Trace ankle edema.  Neuro: alert & oriented x 3, cranial nerves grossly intact. moves all 4 extremities w/o difficulty. Affect pleasant.  ASSESSMENT & PLAN:  1. Chronic diastolic CHF: Patient has NYHA class II symptoms.  Normal EF by echo.  TEE in 2/17 showed severely elevated PA pressure (PASP 78 mmHg). This could be pulmonary venous hypertension in the setting of diastolic CHF.  He has a murmur on exam and focal basal septal hypertrophy with chordal SAM and a mild LV outflow tract gradient.  Lowering this gradient could potentially help his symptoms, therefore Coreg was started.  - Continue current Coreg. - Continue Lasix.  He thinks it may have helped a bit and he does not appear volume overloaded on exam. BMET/BNP today.   2. OSA: Encouraged to continue wearing CPAP nightly. 3. HTN:  BP controlled.  4. Pulmonary hypertension: Noted by echo.  This may be pulmonary venous hypertension (group 2 PH) due to diastolic CHF +/- group 3 PH due to intrinsic lung disease.  I think that group 1 PH is less likely.  He is hypoxic with exertion and has oxygen at home, this suggests possible parenchymal lung disease.  He smoked very little in the past but has obstructive PFTS, suggesting a COPD-like picture.  Of note, V/Q scan was recently normal.  - Would hold off on RHC for now.  - Wear oxygen with activity. We should recheck his oxygen saturation at rest and with exertion at next visit next week, may be improved with Lasix use.   Loralie Champagne 09/22/2015

## 2015-09-26 NOTE — Progress Notes (Signed)
u   Cardiology Office Note   Date:  09/27/2015   ID:  [name], DOB September 19, 1923, MRN [id number]  PCP:  Mathews Argyle, MD  Cardiologist:   [name], MD   Chief Complaint  Patient presents with  . Follow-up    shortness of breath today because rushing     Patient ID: Pedro Hall. is a 82 y.o. male with hypertension, hyperlipidemia, bilateral carotid stenosis, and right subclavian stenosis who presents for an evaluation of shortness of breath.   Interval History 09/27/15: Pedro Hall was evaluated by Pedro Hall on 08/23/15.  He was started on carvedilol due to poorly-controlled hypertension and to decrease his LVOT gradient.  He was also started on lasix.  PFTs showed obstructive lung disease.  He was referred to Dr. Lake Bells.  He started started both medications last week and hasn't noticed any change in how he feels.  He denies lower extremity edema, orthopnea or PND.  He has not noted any chest pain. He continues to exercise for 30-40 minutes daily.  He notes some shortness of breath when he rushes, so he tries not to rush.  He is not wearing his oxygen today in clinic.  He only uses the oxygen when he thinks he needs it, which is not often.    Interval History 07/26/15: After his last appointment Pedro Hall underwent TEE that revealed no RA mass, but rather a prominent Chiari network.  It did reveal severe pulmonary hypertension with PASP at least 78 mmHg.  After the procedure he required supplemental oxygen and was discharged home on 2L.  He otherwise tolerated the procedure well.  He was referred for evaluation by Dr. Loralie Champagne for pulmonary hypertension.  Since the procedure he Has been feeling well. He continues to exercise at the fitness center most days of the week. He has not noticed any limitations there than back pain due to scoliosis. He denies any chest pain or shortness of breath. He did have a CPAP fixed and was wearing a for a short  period of time. However it is now broken again. Pedro Hall s accompanied by his son. He notes that he has actually been feeling short of breath for over a year now. After his transesophageal echo he use the oxygen tanks that he went home with but has not used it anymore.  He has not noted any lower extremity edema, orthopnea, or PND.  Interval History 07/02/15:  After his last appointment Pedro Hall had an echo that showed normal systolic function and grade 1 diastolic dysfunction.  It also showed systolic anterior motion of the mitral valve but only mild focal basal hypertrophy of the septum.  It revealed an intracavitary gradient of 2 m/s and PASP 46 mmHg.  There was concern for a possible RA mass and TEE was recommended.  He typically doesn't have any problem with breathing.  However, when walking a long distance at a slight incline he notes some increased shortness of breath.  He denies chest pain or pressure.  He also has not noted any lower extremity edema, orthopnea or PND.  History of Present Illness 06/08/15:  Pedro Hall started noticing shortness of breath in December.  On Christmas day he was walking up a steep incline and had to stop and rest.  There was associated chest discomfort, nausea, vomiting or diaphoresis. He has not noticed any lower extremity edema, orthopnea or PND. He does have sleep apnea and uses his CPAP machine.  He has also noticed a nonproductive cough in the last couple of weeks. He denies any fevers or chills. However, it has been broken for the last month. Pedro Hall denies lightheadedness, dizziness, or palpitations. He saw Dr. Felipa Eth earlier this week and reported worsening shortness of breath.  Therefore, he was referred to cardiology for further evaluation.  Pedro Hall excercises daily at the fitness center for 35-45 minutes every morning.  He works out on [employer] and sometimes walks.  He denies dyspnea or chest pain or shortness of  breath with this activity.  He used to walk 1 mile per day but has not been doing so lately.     Past Medical History  Diagnosis Date  . Hypertension   . Stenosis of right subclavian artery   . BPH (benign prostatic hypertrophy)   . Arrhythmia   . Hyperlipidemia   . Diverticulosis   . Severe scoliosis   . Bilateral carotid artery stenosis 02/2008  . Hemorrhoids   . Sleep disorder   . Sleep apnea   . Essential hypertension 06/14/2015  . Hyperlipidemia 06/14/2015  . Subclavian artery stenosis, right 06/14/2015  . Scoliosis 06/14/2015  . OSA (obstructive sleep apnea) 06/14/2015  . Pulmonary hypertension ([place]) 07/04/2015    Past Surgical History  Procedure Laterality Date  . Hemorrhoid surgery  09/10/12  . Tee without cardioversion N/A 07/13/2015    Procedure: TRANSESOPHAGEAL ECHOCARDIOGRAM (TEE);  Surgeon: [name], MD;  Location: [facility] ENDOSCOPY;  Service: Cardiovascular;  Laterality: N/A;     Current Outpatient Prescriptions  Medication Sig Dispense Refill  . acetaminophen (TYLENOL) 500 MG tablet Take 500 mg by mouth every 6 (six) hours as needed for mild pain.    Marland Kitchen amLODipine (NORVASC) 5 MG tablet 5 mg once.  0  . aspirin 81 MG tablet Take 81 mg by mouth daily.    . brimonidine-timolol (COMBIGAN) 0.2-0.5 % ophthalmic solution Place 1 drop into both eyes every 12 (twelve) hours.    . Calcium Carbonate-Vit D-Min (GNP CALCIUM PLUS 600 +D) 600-200 MG-UNIT TABS Take 1 tablet by mouth daily.    . carvedilol (COREG) 12.5 MG tablet Take 1 tablet (12.5 mg total) by mouth 2 (two) times daily. 60 tablet 5  . fish oil-omega-3 fatty acids 1000 MG capsule Take 1 g by mouth daily. With food    . furosemide (LASIX) 20 MG tablet Take 1 tablet (20 mg total) by mouth daily. 30 tablet 3  . latanoprost (XALATAN) 0.005 % ophthalmic solution Place 1 drop into both eyes at bedtime.     Marland Kitchen losartan (COZAAR) 100 MG tablet 100 mg once.  11  . MULTIPLE VITAMIN PO Take 1 tablet by mouth daily.    Marland Kitchen MYRBETRIQ 25  MG TB24 Take 25 mg by mouth daily.     Marland Kitchen triamcinolone (KENALOG) 0.025 % cream Apply 1 application topically daily.   0  . vitamin C (ASCORBIC ACID) 500 MG tablet Take 500 mg by mouth daily.     No current facility-administered medications for this visit.    Allergies:   Review of patient's allergies indicates no known allergies.   Social History:  The patient  reports that he quit smoking about 66 years ago. His smoking use included Cigarettes. He has never used smokeless tobacco. He reports that he drinks about 1.8 oz of alcohol per week. He reports that he does not use illicit drugs.   Family History:  The patient's family history is not on file.  ROS:  Please see the history of present illness.   Otherwise, review of systems are positive for none.   All other systems are reviewed and negative.   PHYSICAL EXAM: VS:  BP 140/84 mmHg  Pulse 72  Ht 5\' 9"  (1.753 m)  Wt 67.132 kg (148 lb)  BMI 21.85 kg/m2 , BMI Body mass index is 21.85 kg/(m^2). GENERAL:  Well appearing HEENT:  Pupils equal round and reactive, fundi not visualized, oral mucosa unremarkable NECK:  No jugular venous distention, waveform within normal limits, carotid upstroke brisk and symmetric, no bruits LYMPHATICS:  No cervical adenopathy LUNGS:  Clear to auscultation bilaterally HEART:  RRR.  PMI not displaced or sustained,S1 and S2 within normal limits, no S3, no S4, no clicks, no rubs, III/VI mid-peaking crescendo-decrescendo murmur at the LUSB ABD:  Flat, positive bowel sounds normal in frequency in pitch, no bruits, no rebound, no guarding, no midline pulsatile mass, no hepatomegaly, no splenomegaly EXT:  2 plus pulses throughout, no edema, no cyanosis no clubbing SKIN:  No rashes no nodules NEURO:  Cranial nerves II through XII grossly intact, motor grossly intact throughout PSYCH:  Cognitively intact, oriented to person place and time  EKG:  EKG is not ordered today.  Echo 06/17/15: Study Conclusions  -  Left ventricle: The cavity size was normal. There was mild focal basal hypertrophy of the septum. Systolic function was vigorous. The estimated ejection fraction was in the range of 65% to 70%. Wall motion was normal; there were no regional wall motion abnormalities. Doppler parameters are consistent with abnormal left ventricular relaxation (grade 1 diastolic dysfunction). - Mitral valve: Calcified annulus. - Pulmonary arteries: Systolic pressure was moderately increased.  Impressions:  - Technically difficult; vigorous LV function; grade 1 diastolic dysfunction; proximal septal thickening with chordal SAM; intracavitary gradient of 2 m/s; mild TR with moderately elevated pulmonary pressure; cannot R/O mass right atrium on short axis views; suggest TEE to further assess.  TEE 07/13/15: Study Conclusions  - Left ventricle: Systolic function was normal. Wall motion was normal; there were no regional wall motion abnormalities. - Aortic valve: There was trivial regurgitation. - Mitral valve: There was mild regurgitation. - Left atrium: No evidence of thrombus in the atrial cavity or appendage. No evidence of thrombus in the atrial cavity or appendage. - Right atrium: No evidence of thrombus or mass in the atrial cavity or appendage. - Atrial septum: There was a patent foramen ovale with right to left flow at rest and on saline microcavitation study.  Impressions:  - Severe pulmonary hypertenstion. PASP is at least 78 mmHg.   Recent Labs: 07/02/2015: ALT 16; Hemoglobin 15.3; Platelets 208 09/21/2015: B Natriuretic Peptide 171.0*; BUN 21*; Creatinine, Ser 0.76; Potassium 4.4; Sodium 131*    Lipid Panel No results found for: CHOL, TRIG, HDL, CHOLHDL, VLDL, LDLCALC, LDLDIRECT    Wt Readings from Last 3 Encounters:  09/27/15 67.132 kg (148 lb)  09/21/15 65.913 kg (145 lb 5 oz)  08/23/15 67.64 kg (149 lb 1.9 oz)      ASSESSMENT AND PLAN:   #  Pulmonary hypertension: Pedro Hall is seeing Pedro Hall and has an appointment scheduled with Dr. Lake Bells.  Pulmonary hypertension is felt to be either due to diastolic dysfunction or intrinsic lung disease.  He was started on carvedilol due to an LVOT gradient and BP at the upper limit of normal.  We will increase carvedilol to 12.5mg  bid, as he still has an outflow gradient murmur on exam and BP is  poorly-controlled.  Overall, he remains relatively unaffected unless he exerts himself.  We discussed the importance of oxygen with exertion, and the fact that walking is included in exertion.  His oxygen saturation on room air was 84% when walking in clinic today.  I have strongly suggested that he wear O2 with walking or more strenuous exercise.  Keep follow up with Dr. Lake Bells for management of obstructive lung disease.  # Chronic diastolic heart failure: Euvolemic on exam and BP is OK.  Increasing carvedilol as above.  Continue lasix.  # OSA: Mr. Mathai was encouraged keep wearing his CPAP.  # Hypertension: Continue amlodipine and increase carvedilol as above.   Current medicines are reviewed at length with the patient today.  The patient does not have concerns regarding medicines.  The following changes have been made:  no change  Labs/ tests ordered today include:   No orders of the defined types were placed in this encounter.    Time spent: 45 minutes-Greater than 50% of this time was spent in counseling, explanation of diagnosis, planning of further management, and coordination of care.   Disposition:   FU with Jovanny Stephanie C. Oval Linsey, MD, [facility] in 3 months.   This note was written with the assistance of speech recognition software.  Please excuse any transcriptional errors.  Signed, Cheral Cappucci C. Oval Linsey, MD, [facility]  09/27/2015 5:42 PM    [facility] insulin. Unlikely she stayed until 7

## 2015-09-27 ENCOUNTER — Encounter: Payer: Self-pay | Admitting: Cardiovascular Disease

## 2015-09-27 ENCOUNTER — Ambulatory Visit (INDEPENDENT_AMBULATORY_CARE_PROVIDER_SITE_OTHER): Payer: Medicare Other | Admitting: Cardiovascular Disease

## 2015-09-27 VITALS — BP 140/84 | HR 72 | Ht 69.0 in | Wt 148.0 lb

## 2015-09-27 DIAGNOSIS — I2583 Coronary atherosclerosis due to lipid rich plaque: Secondary | ICD-10-CM

## 2015-09-27 DIAGNOSIS — I272 Other secondary pulmonary hypertension: Secondary | ICD-10-CM

## 2015-09-27 DIAGNOSIS — G4733 Obstructive sleep apnea (adult) (pediatric): Secondary | ICD-10-CM | POA: Diagnosis not present

## 2015-09-27 DIAGNOSIS — I2510 Atherosclerotic heart disease of native coronary artery without angina pectoris: Secondary | ICD-10-CM

## 2015-09-27 DIAGNOSIS — I5032 Chronic diastolic (congestive) heart failure: Secondary | ICD-10-CM

## 2015-09-27 DIAGNOSIS — I10 Essential (primary) hypertension: Secondary | ICD-10-CM

## 2015-09-27 MED ORDER — CARVEDILOL 12.5 MG PO TABS: 12.5000 mg | ORAL_TABLET | Freq: Two times a day (BID) | ORAL | Status: DC

## 2015-09-27 NOTE — Patient Instructions (Signed)
Medication Instructions:  INCREASE YOUR CARVEDILOL (COREG) TO 12.5 MG TWICE A DAY   Labwork: NONE   Testing/Procedures: NONE  Follow-Up: Your physician recommends that you schedule a follow-up appointment in: 3 MONTH OV  If you need a refill on your cardiac medications before your next appointment, please call your pharmacy.

## 2015-10-18 ENCOUNTER — Telehealth: Payer: Self-pay | Admitting: Cardiovascular Disease

## 2015-10-18 NOTE — Telephone Encounter (Signed)
Please call,he would like Dr Oval Linsey to order a potable concentrator for the pt. He knows he will probably need to be evaluated for this. At this point he not using the big one when he is moving around or not at home.He definitely needs to use it,thinks the portable one will work much better for him.

## 2015-10-18 NOTE — Telephone Encounter (Signed)
Patient has qualified for home O2.  OK to order a portable concentrator.  I thought he already had one.

## 2015-10-18 NOTE — Telephone Encounter (Signed)
Spoke with pt's son Coralyn Mark, ok per DPR. He states that his father needs a lighter weight portable oxygen tank to use when he is going out and about. The current set up he has is too big and heavy for him to take with him places. Pt son states his father is not taking the oxygen with him when he goes out for that reason. They are requesting a portable concentrator so that he can receive his oxygen more easily when going places. Pt has an appt with Dr Lake Bells, pulmonology  on 10/21/15.   Please advise. Will route to Dr Oval Linsey and Alvina Filbert.

## 2015-10-21 ENCOUNTER — Ambulatory Visit (INDEPENDENT_AMBULATORY_CARE_PROVIDER_SITE_OTHER): Payer: Medicare Other | Admitting: Pulmonary Disease

## 2015-10-21 ENCOUNTER — Encounter: Payer: Self-pay | Admitting: Pulmonary Disease

## 2015-10-21 VITALS — BP 132/72 | HR 66 | Ht 69.0 in | Wt 158.0 lb

## 2015-10-21 DIAGNOSIS — J9621 Acute and chronic respiratory failure with hypoxia: Secondary | ICD-10-CM

## 2015-10-21 DIAGNOSIS — J984 Other disorders of lung: Secondary | ICD-10-CM | POA: Diagnosis not present

## 2015-10-21 DIAGNOSIS — J449 Chronic obstructive pulmonary disease, unspecified: Secondary | ICD-10-CM | POA: Insufficient documentation

## 2015-10-21 DIAGNOSIS — I272 Other secondary pulmonary hypertension: Secondary | ICD-10-CM

## 2015-10-21 DIAGNOSIS — G4733 Obstructive sleep apnea (adult) (pediatric): Secondary | ICD-10-CM

## 2015-10-21 DIAGNOSIS — J9611 Chronic respiratory failure with hypoxia: Secondary | ICD-10-CM

## 2015-10-21 MED ORDER — TIOTROPIUM BROMIDE MONOHYDRATE 2.5 MCG/ACT IN AERS: 2.0000 | INHALATION_SPRAY | Freq: Every day | RESPIRATORY_TRACT | Status: AC

## 2015-10-21 NOTE — Assessment & Plan Note (Signed)
He has evidence of airflow obstruction which may just be due to his advanced age as this can be seen in older adults as a product of aging. However, he did smoke as a young adult and he's been symptomatic so it's reasonable to treat him as if he has COPD with a bronchodilator to see if this causes improvement in symptoms. However, I believe that he has severe restrictive lung disease based on his kyphosis so I think that airflow obstruction is just one small piece of his puzzle.  Plan: Trial Spiriva

## 2015-10-21 NOTE — Patient Instructions (Signed)
We will call you with the results of the chest CT We will arrange a home oxygen test while you're sleeping Takes Spiriva every day I will see you back in 4-6 weeks or sooner if needed

## 2015-10-21 NOTE — Assessment & Plan Note (Signed)
He has recently been prescribed 2 L of oxygen which seems to be appropriate based on her testing here today in the office. Because of what appears to be restrictive lung disease based on his physical exam and a remarkably low diffusion capacity on his lung function testing I will order a CT scan of his chest to see if there is evidence of some other progressive underlying lung disease such as an interstitial lung disease. However, I think the most likely explanation is severe restriction from his scoliosis and kyphosis and airflow obstruction.

## 2015-10-21 NOTE — Assessment & Plan Note (Signed)
He's been on CPAP for 5 years. He is compliant with therapy. However, I think he probably needs to use oxygen in the evenings.  Plan: Check overnight oximetry test on CPAP alone

## 2015-10-21 NOTE — Progress Notes (Signed)
Subjective:    Patient ID: Pedro Hall., male    DOB: 1924-03-11, 82 y.o.   MRN: [id number]  HPI Chief Complaint  Patient presents with  . PULMONARY CONSULT    Referred by Dr Oval Linsey. Recent PFT - Discuss ECHO is possible.  Arrived on Room air at 81% , states he has O2 at home but is too heavy to use 24/7. Placed on 2Liters O2 and recovered to 93%.    Patient of Dr. Oval Linsey Recently had PFTs, cardiology work up and started on oxygen.  His sons are with him today and are helpful in providing history.  They note that he was diagnosed with sleep apnea about 5 years ago.  About 5-6 months ago he developed "windedness" which was new. His PCP referred him to Dr. [name] for this.  His son notes that this has progressed in last several months to the point that minimal walking (50 feet) will produce dyspnea.  Dr. Oval Linsey evlauated him and recommend that he use oxygen with exertions after an ambulatory walk test showed that his oxygen level dropped on RA with exertion.  He has not been able to use oxygen though because it is too heavy for him with his scoliosis.  This was started 6 weeks ago.  Oxygen really helps him breathe better.  The oxygen has not been hooked up to his CPAP machine that he uses nightly.   He has been experiencing more dry coughing in the last year.  His family notes that it is typically dry.    He smoked 1 ppd for 8-10 years, quit in 1951.   He has always worked in Personal assistant.  He worked two years in the [employer].   The DME has contacted him with the possibility of a continuous flow oxygen.  This is all new for him as he never had a respiratory problem.  In fact he has been very healthy for decades asside from treatment for hypertension (he has taken these since age 26).    His sons note that he falls to sleep quickly which is something that has happened more in the last few years, but had been persistent for decades.  They note that CPAP helped with this  but hasn't completely resolved it.  He is typically regular with exercise in his fitness center and walking regularly.     Past Medical History  Diagnosis Date  . Hypertension   . Stenosis of right subclavian artery   . BPH (benign prostatic hypertrophy)   . Arrhythmia   . Hyperlipidemia   . Diverticulosis   . Severe scoliosis   . Bilateral carotid artery stenosis 02/2008  . Hemorrhoids   . Sleep disorder   . Sleep apnea   . Essential hypertension 06/14/2015  . Hyperlipidemia 06/14/2015  . Subclavian artery stenosis, right 06/14/2015  . Scoliosis 06/14/2015  . OSA (obstructive sleep apnea) 06/14/2015  . Pulmonary hypertension ([place]) 07/04/2015     Family History  Problem Relation Age of Onset  . Prostate cancer Father   . Diabetes Sister   . Alzheimer's disease Mother      Social History   Social History  . Marital Status: Widowed    Spouse Name: N/A  . Number of Children: N/A  . Years of Education: N/A   Occupational History  . Retired    Social History Main Topics  . Smoking status: Former Smoker -- 1.00 packs/day for 10 years    Types: Cigarettes  Quit date: 06/05/1949  . Smokeless tobacco: Never Used  . Alcohol Use: 1.8 oz/week    3 Glasses of wine per week     Comment: Weekly.  . Drug Use: No  . Sexual Activity: Not on file   Other Topics Concern  . Not on file   Social History Narrative   Epworth Sleepiness Scale = 11 (as of 06/10/2015)     No Known Allergies   Outpatient Prescriptions Prior to Visit  Medication Sig Dispense Refill  . acetaminophen (TYLENOL) 500 MG tablet Take 500 mg by mouth every 6 (six) hours as needed for mild pain.    Marland Kitchen amLODipine (NORVASC) 5 MG tablet 5 mg once.  0  . aspirin 81 MG tablet Take 81 mg by mouth daily.    . brimonidine-timolol (COMBIGAN) 0.2-0.5 % ophthalmic solution Place 1 drop into both eyes every 12 (twelve) hours.    . Calcium Carbonate-Vit D-Min (GNP CALCIUM PLUS 600 +D) 600-200 MG-UNIT TABS Take 1 tablet by  mouth daily.    . fish oil-omega-3 fatty acids 1000 MG capsule Take 1 g by mouth daily. With food    . furosemide (LASIX) 20 MG tablet Take 1 tablet (20 mg total) by mouth daily. 30 tablet 3  . latanoprost (XALATAN) 0.005 % ophthalmic solution Place 1 drop into both eyes at bedtime.     Marland Kitchen losartan (COZAAR) 100 MG tablet 100 mg once.  11  . MULTIPLE VITAMIN PO Take 1 tablet by mouth daily.    Marland Kitchen triamcinolone (KENALOG) 0.025 % cream Apply 1 application topically daily.   0  . carvedilol (COREG) 12.5 MG tablet Take 1 tablet (12.5 mg total) by mouth 2 (two) times daily. 60 tablet 5  . MYRBETRIQ 25 MG TB24 Take 25 mg by mouth daily.     . vitamin C (ASCORBIC ACID) 500 MG tablet Take 500 mg by mouth daily.     No facility-administered medications prior to visit.       Review of Systems  Constitutional: Negative for fever and unexpected weight change.  HENT: Negative for congestion, dental problem, ear pain, nosebleeds, postnasal drip, rhinorrhea, sinus pressure, sneezing, sore throat and trouble swallowing.   Eyes: Negative for redness and itching.  Respiratory: Positive for cough and shortness of breath. Negative for chest tightness and wheezing.   Cardiovascular: Negative for palpitations and leg swelling.  Gastrointestinal: Negative for nausea and vomiting.  Genitourinary: Negative for dysuria.  Musculoskeletal: Positive for joint swelling.  Skin: Negative for rash.  Neurological: Negative for headaches.  Hematological: Does not bruise/bleed easily.  Psychiatric/Behavioral: Negative for dysphoric mood. The patient is not nervous/anxious.        Objective:   Physical Exam Filed Vitals:   10/21/15 1502 10/21/15 1506  BP: 132/72   Pulse: 68 66  Height: 5\' 9"  (1.753 m)   Weight: 158 lb (71.668 kg)   SpO2: 82% 93%   RA Increased to 93% on 2L [place]  Gen: elderly male with kyphosis and scoliosis, good spirits HENT: NCAT, OP clear, neck supple without masses Eyes: PERRL,  EOMi Lymph: no cervical lymphadenopathy PULM: Crackles left base, crackles right base, improved air movement on right compared to left CV: RRR, slight systolic murmur noted right upper sternal border, no JVD GI: BS+, soft, nontender, no hsm Derm: no rash or skin breakdown MSK: normal bulk and tone Neuro: A&Ox4, CN II-XII intact, strength 5/5 in all 4 extremities Psyche: normal mood and affect  Cardiology records reviewed were he was  evaluated for pulmonary hypertension and diastolic heart failure, started on oxygen and then referred to me Testing reviewed: 2017 pulmonary function testing ratio 69%, FEV1 1.05 L (70% predicted) sees, DLCO 9.29 (43% predicted). Chest x-ray from 2017 images personally reviewed showing a chronically elevated left hemidiaphragm with what appears to be emphysema.      Assessment & Plan:  COPD mixed type [facility]) He has evidence of airflow obstruction which may just be due to his advanced age as this can be seen in older adults as a product of aging. However, he did smoke as a young adult and he's been symptomatic so it's reasonable to treat him as if he has COPD with a bronchodilator to see if this causes improvement in symptoms. However, I believe that he has severe restrictive lung disease based on his kyphosis so I think that airflow obstruction is just one small piece of his puzzle.  Plan: Trial Spiriva  Pulmonary hypertension ([place]) He has pulmonary hypertension as seen on an echocardiogram. I believe this is directly related to his hypoxemia which is likely more due to restrictive lung disease from kyphosis and probably to some degree from airflow obstruction. His obstructive sleep apnea is likely also contribute into this as well. At this time, considering the fact that this is a Lexicographer group 3 class of pulmonary hypertension I see no role for a right heart catheterization or consideration of a pulmonary vasodilator.  Plan: Continue to  treat lung disease as outlined above and below.  OSA (obstructive sleep apnea) He's been on CPAP for 5 years. He is compliant with therapy. However, I think he probably needs to use oxygen in the evenings.  Plan: Check overnight oximetry test on CPAP alone  Chronic respiratory failure with hypoxia ([place]) He has recently been prescribed 2 L of oxygen which seems to be appropriate based on her testing here today in the office. Because of what appears to be restrictive lung disease based on his physical exam and a remarkably low diffusion capacity on his lung function testing I will order a CT scan of his chest to see if there is evidence of some other progressive underlying lung disease such as an interstitial lung disease. However, I think the most likely explanation is severe restriction from his scoliosis and kyphosis and airflow obstruction.     Current outpatient prescriptions:  .  acetaminophen (TYLENOL) 500 MG tablet, Take 500 mg by mouth every 6 (six) hours as needed for mild pain., Disp: , Rfl:  .  amLODipine (NORVASC) 5 MG tablet, 5 mg once., Disp: , Rfl: 0 .  aspirin 81 MG tablet, Take 81 mg by mouth daily., Disp: , Rfl:  .  brimonidine-timolol (COMBIGAN) 0.2-0.5 % ophthalmic solution, Place 1 drop into both eyes every 12 (twelve) hours., Disp: , Rfl:  .  Calcium Carbonate-Vit D-Min (GNP CALCIUM PLUS 600 +D) 600-200 MG-UNIT TABS, Take 1 tablet by mouth daily., Disp: , Rfl:  .  fish oil-omega-3 fatty acids 1000 MG capsule, Take 1 g by mouth daily. With food, Disp: , Rfl:  .  furosemide (LASIX) 20 MG tablet, Take 1 tablet (20 mg total) by mouth daily., Disp: 30 tablet, Rfl: 3 .  latanoprost (XALATAN) 0.005 % ophthalmic solution, Place 1 drop into both eyes at bedtime. , Disp: , Rfl:  .  losartan (COZAAR) 100 MG tablet, 100 mg once., Disp: , Rfl: 11 .  MULTIPLE VITAMIN PO, Take 1 tablet by mouth daily., Disp: , Rfl:  .  triamcinolone (KENALOG) 0.025 % cream, Apply 1 application  topically daily. , Disp: , Rfl: 0 .  carvedilol (COREG) 12.5 MG tablet, Take 1 tablet (12.5 mg total) by mouth 2 (two) times daily., Disp: 60 tablet, Rfl: 5 .  MYRBETRIQ 25 MG TB24, Take 25 mg by mouth daily. , Disp: , Rfl:  .  Tiotropium Bromide Monohydrate (SPIRIVA RESPIMAT) 2.5 MCG/ACT AERS, Inhale 2 puffs into the lungs daily., Disp: 1 Inhaler, Rfl: 6 .  vitamin C (ASCORBIC ACID) 500 MG tablet, Take 500 mg by mouth daily., Disp: , Rfl:

## 2015-10-21 NOTE — Assessment & Plan Note (Signed)
He has pulmonary hypertension as seen on an echocardiogram. I believe this is directly related to his hypoxemia which is likely more due to restrictive lung disease from kyphosis and probably to some degree from airflow obstruction. His obstructive sleep apnea is likely also contribute into this as well. At this time, considering the fact that this is a Lexicographer group 3 class of pulmonary hypertension I see no role for a right heart catheterization or consideration of a pulmonary vasodilator.  Plan: Continue to treat lung disease as outlined above and below.

## 2015-10-22 ENCOUNTER — Encounter (HOSPITAL_COMMUNITY): Payer: Self-pay | Admitting: *Deleted

## 2015-10-22 ENCOUNTER — Emergency Department (HOSPITAL_COMMUNITY): Payer: Medicare Other

## 2015-10-22 ENCOUNTER — Observation Stay (HOSPITAL_COMMUNITY): Payer: Medicare Other

## 2015-10-22 ENCOUNTER — Observation Stay (HOSPITAL_COMMUNITY)
Admission: EM | Admit: 2015-10-22 | Discharge: 2015-10-25 | Disposition: A | Discharge status: Skilled Nursing Facility | Payer: Medicare Other | Attending: Internal Medicine | Admitting: Internal Medicine

## 2015-10-22 DIAGNOSIS — E785 Hyperlipidemia, unspecified: Secondary | ICD-10-CM | POA: Diagnosis present

## 2015-10-22 DIAGNOSIS — I110 Hypertensive heart disease with heart failure: Secondary | ICD-10-CM | POA: Insufficient documentation

## 2015-10-22 DIAGNOSIS — Z66 Do not resuscitate: Secondary | ICD-10-CM | POA: Diagnosis not present

## 2015-10-22 DIAGNOSIS — I272 Other secondary pulmonary hypertension: Secondary | ICD-10-CM | POA: Insufficient documentation

## 2015-10-22 DIAGNOSIS — I10 Essential (primary) hypertension: Secondary | ICD-10-CM | POA: Diagnosis not present

## 2015-10-22 DIAGNOSIS — M21331 Wrist drop, right wrist: Secondary | ICD-10-CM | POA: Diagnosis not present

## 2015-10-22 DIAGNOSIS — Z7982 Long term (current) use of aspirin: Secondary | ICD-10-CM | POA: Insufficient documentation

## 2015-10-22 DIAGNOSIS — R531 Weakness: Secondary | ICD-10-CM | POA: Diagnosis not present

## 2015-10-22 DIAGNOSIS — R05 Cough: Secondary | ICD-10-CM | POA: Insufficient documentation

## 2015-10-22 DIAGNOSIS — Z87891 Personal history of nicotine dependence: Secondary | ICD-10-CM | POA: Insufficient documentation

## 2015-10-22 DIAGNOSIS — R29898 Other symptoms and signs involving the musculoskeletal system: Secondary | ICD-10-CM

## 2015-10-22 DIAGNOSIS — R29810 Facial weakness: Secondary | ICD-10-CM | POA: Diagnosis not present

## 2015-10-22 DIAGNOSIS — R059 Cough, unspecified: Secondary | ICD-10-CM

## 2015-10-22 DIAGNOSIS — I639 Cerebral infarction, unspecified: Secondary | ICD-10-CM | POA: Diagnosis not present

## 2015-10-22 DIAGNOSIS — Z79899 Other long term (current) drug therapy: Secondary | ICD-10-CM | POA: Insufficient documentation

## 2015-10-22 DIAGNOSIS — J449 Chronic obstructive pulmonary disease, unspecified: Secondary | ICD-10-CM | POA: Diagnosis not present

## 2015-10-22 DIAGNOSIS — G4733 Obstructive sleep apnea (adult) (pediatric): Secondary | ICD-10-CM | POA: Diagnosis present

## 2015-10-22 DIAGNOSIS — R2990 Unspecified symptoms and signs involving the nervous system: Secondary | ICD-10-CM | POA: Diagnosis present

## 2015-10-22 DIAGNOSIS — G5631 Lesion of radial nerve, right upper limb: Secondary | ICD-10-CM | POA: Diagnosis not present

## 2015-10-22 DIAGNOSIS — G5630 Lesion of radial nerve, unspecified upper limb: Secondary | ICD-10-CM

## 2015-10-22 DIAGNOSIS — I5032 Chronic diastolic (congestive) heart failure: Secondary | ICD-10-CM | POA: Diagnosis not present

## 2015-10-22 HISTORY — DX: Basal cell carcinoma of skin, unspecified: C44.91

## 2015-10-22 HISTORY — DX: Cardiac murmur, unspecified: R01.1

## 2015-10-22 HISTORY — DX: Dependence on supplemental oxygen: Z99.81

## 2015-10-22 HISTORY — DX: Headache: R51

## 2015-10-22 HISTORY — DX: Low back pain: M54.5

## 2015-10-22 HISTORY — DX: Obstructive sleep apnea (adult) (pediatric): G47.33

## 2015-10-22 HISTORY — DX: Dependence on other enabling machines and devices: Z99.89

## 2015-10-22 HISTORY — DX: Malignant melanoma of skin, unspecified: C43.9

## 2015-10-22 HISTORY — DX: Unspecified osteoarthritis, unspecified site: M19.90

## 2015-10-22 HISTORY — DX: Low back pain, unspecified: M54.50

## 2015-10-22 HISTORY — DX: Other chronic pain: G89.29

## 2015-10-22 HISTORY — DX: Headache, unspecified: R51.9

## 2015-10-22 HISTORY — DX: Reserved for concepts with insufficient information to code with codable children: IMO0002

## 2015-10-22 LAB — CBC
HEMATOCRIT: 43 % (ref 39.0–52.0)
Hemoglobin: 14.1 g/dL (ref 13.0–17.0)
MCH: 30.9 pg (ref 26.0–34.0)
MCHC: 32.8 g/dL (ref 30.0–36.0)
MCV: 94.1 fL (ref 78.0–100.0)
PLATELETS: 158 10*3/uL (ref 150–400)
RBC: 4.57 MIL/uL (ref 4.22–5.81)
RDW: 13.8 % (ref 11.5–15.5)
WBC: 5.9 10*3/uL (ref 4.0–10.5)

## 2015-10-22 LAB — COMPREHENSIVE METABOLIC PANEL
ALT: 55 U/L (ref 17–63)
AST: 32 U/L (ref 15–41)
Albumin: 3.8 g/dL (ref 3.5–5.0)
Alkaline Phosphatase: 45 U/L (ref 38–126)
Anion gap: 10 (ref 5–15)
BUN: 12 mg/dL (ref 6–20)
CALCIUM: 9 mg/dL (ref 8.9–10.3)
CO2: 29 mmol/L (ref 22–32)
Chloride: 94 mmol/L — ABNORMAL LOW (ref 101–111)
Creatinine, Ser: 0.68 mg/dL (ref 0.61–1.24)
GFR calc Af Amer: >60 mL/min (ref >60)
GLUCOSE: 89 mg/dL (ref 65–99)
Potassium: 4.6 mmol/L (ref 3.5–5.1)
SODIUM: 133 mmol/L — AB (ref 135–145)
TOTAL PROTEIN: 6.6 g/dL (ref 6.5–8.1)
Total Bilirubin: 1 mg/dL (ref 0.3–1.2)

## 2015-10-22 LAB — DIFFERENTIAL
BASOS PCT: 1 %
Basophils Absolute: 0 10*3/uL (ref 0.0–0.1)
EOS ABS: 0.2 10*3/uL (ref 0.0–0.7)
EOS PCT: 3 %
Lymphocytes Relative: 19 %
Lymphs Abs: 1.1 10*3/uL (ref 0.7–4.0)
MONO ABS: 0.7 10*3/uL (ref 0.1–1.0)
MONOS PCT: 11 %
Neutro Abs: 3.9 10*3/uL (ref 1.7–7.7)
Neutrophils Relative %: 66 %

## 2015-10-22 LAB — I-STAT TROPONIN, ED: Troponin i, poc: 0.01 ng/mL (ref 0.00–0.08)

## 2015-10-22 LAB — I-STAT CHEM 8, ED
BUN: 16 mg/dL (ref 6–20)
CALCIUM ION: 1.11 mmol/L — AB (ref 1.13–1.30)
CHLORIDE: 91 mmol/L — AB (ref 101–111)
CREATININE: 0.8 mg/dL (ref 0.61–1.24)
GLUCOSE: 85 mg/dL (ref 65–99)
HCT: 46 % (ref 39.0–52.0)
Hemoglobin: 15.6 g/dL (ref 13.0–17.0)
POTASSIUM: 4.4 mmol/L (ref 3.5–5.1)
Sodium: 134 mmol/L — ABNORMAL LOW (ref 135–145)
TCO2: 30 mmol/L (ref 0–100)

## 2015-10-22 LAB — APTT: aPTT: 27 seconds (ref 24–37)

## 2015-10-22 LAB — PROTIME-INR
INR: 1.12 (ref 0.00–1.49)
PROTHROMBIN TIME: 14.6 s (ref 11.6–15.2)

## 2015-10-22 LAB — CBG MONITORING, ED: Glucose-Capillary: 90 mg/dL (ref 65–99)

## 2015-10-22 MED ORDER — ACETAMINOPHEN 325 MG PO TABS
650.0000 mg | ORAL_TABLET | ORAL | Status: DC | PRN
  Administered 2015-10-23 – 2015-10-24 (×2): 650 mg via ORAL
  Filled 2015-10-22 (×2): qty 2

## 2015-10-22 MED ORDER — STROKE: EARLY STAGES OF RECOVERY BOOK
Freq: Once | Status: AC
  Administered 2015-10-22: 18:00:00
  Filled 2015-10-22: qty 1

## 2015-10-22 MED ORDER — AMLODIPINE BESYLATE 5 MG PO TABS
5.0000 mg | ORAL_TABLET | Freq: Once | ORAL | Status: AC
  Administered 2015-10-22: 5 mg via ORAL
  Filled 2015-10-22: qty 1

## 2015-10-22 MED ORDER — BRIMONIDINE TARTRATE 0.2 % OP SOLN
1.0000 [drp] | Freq: Two times a day (BID) | OPHTHALMIC | Status: DC
  Administered 2015-10-22 – 2015-10-25 (×6): 1 [drp] via OPHTHALMIC
  Filled 2015-10-22: qty 5

## 2015-10-22 MED ORDER — FUROSEMIDE 10 MG/ML IJ SOLN
20.0000 mg | Freq: Once | INTRAMUSCULAR | Status: AC
  Administered 2015-10-22: 20 mg via INTRAVENOUS
  Filled 2015-10-22: qty 2

## 2015-10-22 MED ORDER — ACETAMINOPHEN 650 MG RE SUPP: 650.0000 mg | RECTAL | Status: DC | PRN

## 2015-10-22 MED ORDER — TIOTROPIUM BROMIDE MONOHYDRATE 18 MCG IN CAPS
18.0000 ug | ORAL_CAPSULE | Freq: Every day | RESPIRATORY_TRACT | Status: DC
  Filled 2015-10-22: qty 5

## 2015-10-22 MED ORDER — ASPIRIN EC 81 MG PO TBEC
81.0000 mg | DELAYED_RELEASE_TABLET | Freq: Every day | ORAL | Status: DC
  Administered 2015-10-23 – 2015-10-25 (×3): 81 mg via ORAL
  Filled 2015-10-22 (×3): qty 1

## 2015-10-22 MED ORDER — LATANOPROST 0.005 % OP SOLN
1.0000 [drp] | Freq: Every day | OPHTHALMIC | Status: DC
  Administered 2015-10-22 – 2015-10-24 (×3): 1 [drp] via OPHTHALMIC
  Filled 2015-10-22: qty 2.5

## 2015-10-22 MED ORDER — BRIMONIDINE TARTRATE-TIMOLOL 0.2-0.5 % OP SOLN: 1.0000 [drp] | Freq: Two times a day (BID) | OPHTHALMIC | Status: DC

## 2015-10-22 MED ORDER — TIMOLOL MALEATE 0.5 % OP SOLN
1.0000 [drp] | Freq: Two times a day (BID) | OPHTHALMIC | Status: DC
  Administered 2015-10-22 – 2015-10-25 (×6): 1 [drp] via OPHTHALMIC
  Filled 2015-10-22: qty 5

## 2015-10-22 MED ORDER — ASPIRIN 325 MG PO TABS
325.0000 mg | ORAL_TABLET | Freq: Once | ORAL | Status: AC
  Administered 2015-10-22: 325 mg via ORAL
  Filled 2015-10-22: qty 1

## 2015-10-22 MED ORDER — CARVEDILOL 12.5 MG PO TABS
12.5000 mg | ORAL_TABLET | Freq: Two times a day (BID) | ORAL | Status: DC
  Administered 2015-10-22 – 2015-10-23 (×3): 12.5 mg via ORAL
  Filled 2015-10-22 (×3): qty 1

## 2015-10-22 MED ORDER — SENNOSIDES-DOCUSATE SODIUM 8.6-50 MG PO TABS: 1.0000 | ORAL_TABLET | Freq: Every evening | ORAL | Status: DC | PRN

## 2015-10-22 MED ORDER — ENOXAPARIN SODIUM 40 MG/0.4ML ~~LOC~~ SOLN
40.0000 mg | SUBCUTANEOUS | Status: DC
  Administered 2015-10-22 – 2015-10-24 (×3): 40 mg via SUBCUTANEOUS
  Filled 2015-10-22 (×3): qty 0.4

## 2015-10-22 MED ORDER — TIOTROPIUM BROMIDE MONOHYDRATE 18 MCG IN CAPS
18.0000 ug | ORAL_CAPSULE | Freq: Every day | RESPIRATORY_TRACT | Status: DC
  Administered 2015-10-22 – 2015-10-25 (×4): 18 ug via RESPIRATORY_TRACT
  Filled 2015-10-22: qty 5

## 2015-10-22 MED ORDER — LOSARTAN POTASSIUM 50 MG PO TABS
100.0000 mg | ORAL_TABLET | Freq: Once | ORAL | Status: AC
  Administered 2015-10-22: 100 mg via ORAL
  Filled 2015-10-22 (×2): qty 2

## 2015-10-22 NOTE — ED Notes (Signed)
Via amion, sent Neuro PA @ 1320 letting him know pt had returned, per his request.

## 2015-10-22 NOTE — Consult Note (Signed)
Requesting Physician: Dr. elgergawy    Chief Complaint: stroke   HPI:                                                                                                                                         Pedro Hall. is an 82 y.o. male present to ED today for evaluation of right-sided weakness. He was last known normal at 9:30, spoke on the telephone with someone. Early this morning patient called son stating that he was having difficulty buttoning his shirt or putting on pants. His right hand was weak, movements uncoordinated. No right lower extremity weakness. Son notes that initially patient seemed a little confused over the telephone but that subsided. Patient's son called EMS. Currently he feels slightly clumsy with his right hand but improved. No facial droop appreciated when smiles.   Date last known well: 5.18.2017 Time last known well: Time: 21:30 tPA Given: No: out of window   Past Medical History  Diagnosis Date  . Hypertension   . Stenosis of right subclavian artery   . BPH (benign prostatic hypertrophy)   . Arrhythmia   . Hyperlipidemia   . Diverticulosis   . Severe scoliosis   . Bilateral carotid artery stenosis 02/2008  . Hemorrhoids   . Sleep disorder   . Sleep apnea   . Essential hypertension 06/14/2015  . Hyperlipidemia 06/14/2015  . Subclavian artery stenosis, right 06/14/2015  . Scoliosis 06/14/2015  . OSA (obstructive sleep apnea) 06/14/2015  . Pulmonary hypertension ([place]) 07/04/2015    Past Surgical History  Procedure Laterality Date  . Hemorrhoid surgery  09/10/12  . Tee without cardioversion N/A 07/13/2015    Procedure: TRANSESOPHAGEAL ECHOCARDIOGRAM (TEE);  Surgeon: [name], MD;  Location: [facility] ENDOSCOPY;  Service: Cardiovascular;  Laterality: N/A;    Family History  Problem Relation Age of Onset  . Prostate cancer Father   . Diabetes Sister   . Alzheimer's disease Mother    Social History:  reports that he quit smoking about 66 years ago.  His smoking use included Cigarettes. He has a 10 pack-year smoking history. He has never used smokeless tobacco. He reports that he drinks about 1.8 oz of alcohol per week. He reports that he does not use illicit drugs.  Allergies: No Known Allergies  Medications:  Current Facility-Administered Medications  Medication Dose Route Frequency Provider Last Rate Last Dose  .  stroke: mapping our early stages of recovery book   Does not apply Once Willia Craze, NP      . acetaminophen (TYLENOL) tablet 650 mg  650 mg Oral Q4H PRN Willia Craze, NP       Or  . acetaminophen (TYLENOL) suppository 650 mg  650 mg Rectal Q4H PRN Willia Craze, NP      . amLODipine (NORVASC) tablet 5 mg  5 mg Oral Once Willia Craze, NP      . aspirin tablet 325 mg  325 mg Oral Once Willia Craze, NP      . Derrill Memo ON 10/23/2015] aspirin tablet 81 mg  81 mg Oral Daily Willia Craze, NP      . brimonidine-timolol (COMBIGAN) 0.2-0.5 % ophthalmic solution 1 drop  1 drop Both Eyes Q12H Willia Craze, NP      . carvedilol (COREG) tablet 12.5 mg  12.5 mg Oral BID Willia Craze, NP      . enoxaparin (LOVENOX) injection 40 mg  40 mg Subcutaneous Q24H Willia Craze, NP      . furosemide (LASIX) injection 20 mg  20 mg Intravenous Once Willia Craze, NP      . latanoprost (XALATAN) 0.005 % ophthalmic solution 1 drop  1 drop Both Eyes QHS Willia Craze, NP      . losartan (COZAAR) tablet 100 mg  100 mg Oral Once Willia Craze, NP      . senna-docusate (Senokot-S) tablet 1 tablet  1 tablet Oral QHS PRN Willia Craze, NP      . Tiotropium Bromide Monohydrate AERS 2 puff  2 puff Inhalation Daily Willia Craze, NP       Current Outpatient Prescriptions  Medication Sig Dispense Refill  . acetaminophen (TYLENOL) 500 MG tablet Take 500 mg by mouth every 6 (six) hours as needed  for mild pain.    Marland Kitchen amLODipine (NORVASC) 5 MG tablet 5 mg once.  0  . aspirin 81 MG tablet Take 81 mg by mouth daily.    . brimonidine-timolol (COMBIGAN) 0.2-0.5 % ophthalmic solution Place 1 drop into both eyes every 12 (twelve) hours.    . Calcium Carbonate-Vit D-Min (GNP CALCIUM PLUS 600 +D) 600-200 MG-UNIT TABS Take 1 tablet by mouth daily.    . carvedilol (COREG) 12.5 MG tablet Take 1 tablet (12.5 mg total) by mouth 2 (two) times daily. 60 tablet 5  . fish oil-omega-3 fatty acids 1000 MG capsule Take 1 g by mouth daily. With food    . furosemide (LASIX) 20 MG tablet Take 1 tablet (20 mg total) by mouth daily. 30 tablet 3  . latanoprost (XALATAN) 0.005 % ophthalmic solution Place 1 drop into both eyes at bedtime.     Marland Kitchen losartan (COZAAR) 100 MG tablet 100 mg once.  11  . MULTIPLE VITAMIN PO Take 1 tablet by mouth daily.    Marland Kitchen MYRBETRIQ 25 MG TB24 Take 25 mg by mouth daily.     . Tiotropium Bromide Monohydrate (SPIRIVA RESPIMAT) 2.5 MCG/ACT AERS Inhale 2 puffs into the lungs daily. 1 Inhaler 6  . triamcinolone (KENALOG) 0.025 % cream Apply 1 application topically daily.   0  . vitamin C (ASCORBIC ACID) 500 MG tablet Take 500 mg by mouth daily.       ROS:  History obtained from the patient  General ROS: negative for - chills, fatigue, fever, night sweats, weight gain or weight loss Psychological ROS: negative for - behavioral disorder, hallucinations, memory difficulties, mood swings or suicidal ideation Ophthalmic ROS: negative for - blurry vision, double vision, eye pain or loss of vision ENT ROS: negative for - epistaxis, nasal discharge, oral lesions, sore throat, tinnitus or vertigo Allergy and Immunology ROS: negative for - hives or itchy/watery eyes Hematological and Lymphatic ROS: negative for - bleeding problems, bruising or swollen lymph  nodes Endocrine ROS: negative for - galactorrhea, hair pattern changes, polydipsia/polyuria or temperature intolerance Respiratory ROS: negative for - cough, hemoptysis, shortness of breath or wheezing Cardiovascular ROS: negative for - chest pain, dyspnea on exertion, edema or irregular heartbeat Gastrointestinal ROS: negative for - abdominal pain, diarrhea, hematemesis, nausea/vomiting or stool incontinence Genito-Urinary ROS: negative for - dysuria, hematuria, incontinence or urinary frequency/urgency Musculoskeletal ROS: negative for - joint swelling or muscular weakness Neurological ROS: as noted in HPI Dermatological ROS: negative for rash and skin lesion changes  Neurologic Examination:                                                                                                      Blood pressure 143/85, pulse 67, temperature 97.4 F (36.3 C), temperature source Oral, resp. rate 22, height 5\' 9"  (1.753 m), weight 65.772 kg (145 lb), SpO2 96 %.  HEENT-  Normocephalic, no lesions, without obvious abnormality.  Normal external eye and conjunctiva.  Normal TM's bilaterally.  Normal auditory canals and external ears. Normal external nose, mucus membranes and septum.  Normal pharynx. Cardiovascular- S1, S2 normal, pulses palpable throughout   Lungs- chest clear, no wheezing, rales, normal symmetric air entry Abdomen- normal findings: bowel sounds normal Extremities- no edema Lymph-no adenopathy palpable Musculoskeletal-no joint tenderness, deformity or swelling Skin-warm and dry, no hyperpigmentation, vitiligo, or suspicious lesions  Neurological Examination Mental Status: Alert, oriented, thought content appropriate.  Speech fluent without evidence of aphasia.  Able to follow 3 step commands without difficulty. Cranial Nerves: II:  Visual fields grossly normal, pupils equal, round, reactive to light and accommodation III,IV, VI: ptosis not present, extra-ocular motions intact  bilaterally V,VII: smile symmetric with right NLF decrease at rest, facial light touch sensation normal bilaterally VIII: hearing normal bilaterally IX,X: uvula rises symmetrically XI: bilateral shoulder shrug XII: midline tongue extension Motor: Right : Upper extremity   5/5    Left:     Upper extremity   5/5  Lower extremity   5/5     Lower extremity   5/5 Tone and bulk:normal tone throughout; no atrophy noted Sensory: Pinprick and light touch intact throughout, bilaterally Deep Tendon Reflexes: 2+ and symmetric throughout UE  No LE DTR Plantars: Right: downgoing   Left: downgoing Cerebellar: normal finger-to-nose,  and normal heel-to-shin test Gait: not tested       Lab Results: Basic Metabolic Panel:  Recent Labs Lab 10/22/15 0811 10/22/15 0820  NA 133* 134*  K 4.6 4.4  CL 94* 91*  CO2 29  --   GLUCOSE 89  85  BUN 12 16  CREATININE 0.68 0.80  CALCIUM 9.0  --     Liver Function Tests:  Recent Labs Lab 10/22/15 0811  AST 32  ALT 55  ALKPHOS 45  BILITOT 1.0  PROT 6.6  ALBUMIN 3.8   No results for input(s): LIPASE, AMYLASE in the last 168 hours. No results for input(s): AMMONIA in the last 168 hours.  CBC:  Recent Labs Lab 10/22/15 0811 10/22/15 0820  WBC 5.9  --   NEUTROABS 3.9  --   HGB 14.1 15.6  HCT 43.0 46.0  MCV 94.1  --   PLT 158  --     Cardiac Enzymes: No results for input(s): CKTOTAL, CKMB, CKMBINDEX, TROPONINI in the last 168 hours.  Lipid Panel: No results for input(s): CHOL, TRIG, HDL, CHOLHDL, VLDL, LDLCALC in the last 168 hours.  CBG:  Recent Labs Lab 10/22/15 0810  GLUCAP 90    Microbiology: No results found for this or any previous visit.  Coagulation Studies:  Recent Labs  10/22/15 0811  LABPROT 14.6  INR 1.12    Imaging: Dg Chest 2 View  10/22/2015  CLINICAL DATA:  Cough, hypertension, former smoker, pulmonary hypertension EXAM: CHEST  2 VIEW COMPARISON:  07/27/2015 FINDINGS: Marked chronic elevation of  LEFT diaphragm. Normal heart size and pulmonary vascularity. Calcified tortuous thoracic aorta. LEFT basilar atelectasis again seen. Remaining lungs clear. No definite pleural effusion or pneumothorax. Bones diffusely demineralized. IMPRESSION: Chronic elevation of LEFT diaphragm with LEFT basilar atelectasis. Electronically Signed   By: Lavonia Dana M.D.   On: 10/22/2015 11:55   Ct Head Wo Contrast  10/22/2015  CLINICAL DATA:  Right hand weakness. EXAM: CT HEAD WITHOUT CONTRAST TECHNIQUE: Contiguous axial images were obtained from the base of the skull through the vertex without intravenous contrast. COMPARISON:  None. FINDINGS: Calcifications along the falx and tentorium. No evidence for an intracranial hemorrhage. Areas of low-density in the subcortical white matter suggest chronic changes. No evidence for a large acute infarct, mass lesion, midline shift or hydrocephalus. Mucosal disease in the left frontal sinus and ethmoid air cells. Complete opacification of the left maxillary sinus. No calvarial fracture. IMPRESSION: No acute intracranial abnormality. Low-density in the white matter is suggestive for chronic small vessel ischemic changes. Paranasal sinus disease, most prominent in left maxillary sinus. Electronically Signed   By: Markus Daft M.D.   On: 10/22/2015 09:36   Mr Brain Wo Contrast  10/22/2015  CLINICAL DATA:  Right facial droop and right hand weakness. EXAM: MRI HEAD WITHOUT CONTRAST MRA HEAD WITHOUT CONTRAST TECHNIQUE: Multiplanar, multiecho pulse sequences of the brain and surrounding structures were obtained without intravenous contrast. Angiographic images of the head were obtained using MRA technique without contrast. COMPARISON:  Head CT 10/22/2015 FINDINGS: MRI HEAD FINDINGS There is no evidence of acute infarct, mass, midline shift, or extra-axial fluid collection. Chronic microhemorrhages are noted in the right temporal lobe and left frontal operculum. There is moderate cerebral  atrophy. Patchy T2 hyperintensities in the cerebral white matter greatest in the periventricular regions and nonspecific but compatible with mild-to-moderate chronic small vessel ischemic disease. There is a chronic lacunar infarct in the left thalamus. Prior bilateral cataract extraction is noted. There is chronic left maxillary sinusitis with complete sinus opacification. Partial left frontal and bilateral ethmoid sinus opacification is also noted. The mastoid air cells are clear. Major intracranial vascular flow voids are preserved with the left vertebral artery dominant. The visualized portion of the cervical spine demonstrates multilevel disc  degeneration with evidence of spinal stenosis and mild spinal cord flattening at C2-3 and C3-4, incompletely evaluated. MRA HEAD FINDINGS The study is mildly motion degraded. The visualized distal left vertebral artery is widely patent and dominant. The distal right vertebral artery is hypoplastic. The basilar arteries widely patent. SCA origins are patent. Posterior communicating arteries are small or absent. PCAs are patent with mild branch vessel irregularity but no significant proximal stenosis. The internal carotid arteries are patent from skullbase to carotid termini without stenosis. The cavernous segments are mildly ectatic bilaterally, left greater than right. The left A1 segment is dominant. The right A1 segment is hypoplastic with assessment for stenosis limited by its small size and motion artifact through this region. There is a slightly bulbous appearance of the anterior communicating artery region, however assessment for a discrete aneurysm is limited by motion. MCAs are patent without evidence of significant M1 stenosis. There is mild bilateral MCA branch vessel irregularity. IMPRESSION: 1. No acute intracranial abnormality. 2. Mild to moderate chronic small vessel ischemic disease. Moderate cerebral atrophy. 3. Motion degraded head MRA without evidence of  major vessel occlusion or flow limiting proximal stenosis. Electronically Signed   By: Logan Bores M.D.   On: 10/22/2015 13:27   Mr Jodene Nam Head/brain Wo Cm  10/22/2015  CLINICAL DATA:  Right facial droop and right hand weakness. EXAM: MRI HEAD WITHOUT CONTRAST MRA HEAD WITHOUT CONTRAST TECHNIQUE: Multiplanar, multiecho pulse sequences of the brain and surrounding structures were obtained without intravenous contrast. Angiographic images of the head were obtained using MRA technique without contrast. COMPARISON:  Head CT 10/22/2015 FINDINGS: MRI HEAD FINDINGS There is no evidence of acute infarct, mass, midline shift, or extra-axial fluid collection. Chronic microhemorrhages are noted in the right temporal lobe and left frontal operculum. There is moderate cerebral atrophy. Patchy T2 hyperintensities in the cerebral white matter greatest in the periventricular regions and nonspecific but compatible with mild-to-moderate chronic small vessel ischemic disease. There is a chronic lacunar infarct in the left thalamus. Prior bilateral cataract extraction is noted. There is chronic left maxillary sinusitis with complete sinus opacification. Partial left frontal and bilateral ethmoid sinus opacification is also noted. The mastoid air cells are clear. Major intracranial vascular flow voids are preserved with the left vertebral artery dominant. The visualized portion of the cervical spine demonstrates multilevel disc degeneration with evidence of spinal stenosis and mild spinal cord flattening at C2-3 and C3-4, incompletely evaluated. MRA HEAD FINDINGS The study is mildly motion degraded. The visualized distal left vertebral artery is widely patent and dominant. The distal right vertebral artery is hypoplastic. The basilar arteries widely patent. SCA origins are patent. Posterior communicating arteries are small or absent. PCAs are patent with mild branch vessel irregularity but no significant proximal stenosis. The internal  carotid arteries are patent from skullbase to carotid termini without stenosis. The cavernous segments are mildly ectatic bilaterally, left greater than right. The left A1 segment is dominant. The right A1 segment is hypoplastic with assessment for stenosis limited by its small size and motion artifact through this region. There is a slightly bulbous appearance of the anterior communicating artery region, however assessment for a discrete aneurysm is limited by motion. MCAs are patent without evidence of significant M1 stenosis. There is mild bilateral MCA branch vessel irregularity. IMPRESSION: 1. No acute intracranial abnormality. 2. Mild to moderate chronic small vessel ischemic disease. Moderate cerebral atrophy. 3. Motion degraded head MRA without evidence of major vessel occlusion or flow limiting proximal stenosis. Electronically Signed  By: Logan Bores M.D.   On: 10/22/2015 13:27       Assessment and plan discussed with with attending physician and they are in agreement.    Etta Quill PA-C [facility] [phone]  10/22/2015, 1:56 PM   Assessment: 82 y.o. male with transient right facial droop and clumsiness of right hand. MRI shows no acute infarct and symptoms resolving. Cannot rule out TIA.   Stroke Risk Factors - hyperlipidemia and hypertension   Recommend: 1. HgbA1c, fasting lipid panel 3. PT consult, OT consult, Speech consult 4. Echocardiogram 5. Carotid dopplers 6. Prophylactic therapy-Antiplatelet med: Plavix - dose 75 mg daily 7. Risk factor modification 8. Telemetry monitoring 9. Frequent neuro checks 10 NPO until passes stroke swallow screen 11 please page stroke NP  Or  PA  Or MD from 8am -4 pm  as this patient from this time will be  followed by the stroke.   You can look them up on [email or web address]  Password [email or web address]

## 2015-10-22 NOTE — ED Notes (Signed)
Heart healthy lunch tray ordered @ 1205.

## 2015-10-22 NOTE — ED Notes (Addendum)
Pt presents via [facility] from home c/o stroke like symptoms.  Pt LSN 930 pm last night 5/18.  Pt presents via right side facial droop and right side weakness.  Hx: HTN, HLD, carotid stenosis.  Ax 4, NAD.  Ambulatory.   Pt recently started using home O2 for possible COPD.  18g LAC.   BP 168/84 P-76 NSR O2-95% 2L.  CBG 104

## 2015-10-22 NOTE — ED Notes (Signed)
MD at bedside. 

## 2015-10-22 NOTE — ED Notes (Signed)
Patient transported to MRI 

## 2015-10-22 NOTE — Progress Notes (Signed)
Patient not placed on CPAP.  Patient resting well, sats 93-95% on 3L nasal cannula while sleeping.  Patient unable to communicate about home regimen for CPAP and would be unable to remove mask in the event of an emergency.  Will continue to monitor.

## 2015-10-22 NOTE — ED Notes (Signed)
Neuro PA at bedside.  

## 2015-10-22 NOTE — ED Notes (Signed)
Per Neuro PA [place], [place] to see pt prior to transporting to floor.

## 2015-10-22 NOTE — ED Notes (Signed)
Pt's lunch tray delivered to bedside.

## 2015-10-22 NOTE — Progress Notes (Signed)
Patient arrived to [phone]. Patient is alert, oriented x4, NIHHS 1 for facial droop, equal grips, moving all extremities well, denies pain, skin is intact. Tele started and confirmed by Phineas Real, RN. Bed alarm on, RN explained fall prevention policy. Patient and family vocalized understanding. Patient and family updated on plan of care. Patient laying bed bed using urinal, states he does not need assistance. Will continue to monitor closely. Call alarm and bed alarm on patient's bed.

## 2015-10-22 NOTE — ED Notes (Signed)
Patient transported to X-ray 

## 2015-10-22 NOTE — ED Notes (Signed)
Admitting MD at bedside.

## 2015-10-22 NOTE — H&P (Signed)
History and Physical    Pedro Hall. [id number] DOB: 1923/08/08 DOA: 10/22/2015  PCP: Mathews Argyle, MD  Patient coming from:  Home -EMS Chief Complaint: right sided weakness  HPI: Pedro Hall. is a 82 y.o. male with medical history significant for but not necessarily limited to,  Hypertension, sleep apnea, pulmonary HTN, and carotid artery stenosis.  Patient had an initial consult with Pulmonologist yesterday for abnormal pulmonary function studies, SOB. Patient hypoxic on arrival to office. as 81%. Overall picture felt to be c/w COPD, likely severe restrictive lung disease based on his kyphosis. Prescribed Spiriva, 0ogen at night and scheduled for sleep study with oximetry.  Patient present to ED today for evaluation of right-sided weakness. He was last known normal at 9:30, spoke on the telephone with someone. Early this morning patient called son stating that he was having difficulty buttoning his shirt or putting on pants. His right hand was weak, movements uncoordinated. No right lower extremity weakness. Son notes that initially patient seemed a little confused over the telephone but that subsided. Patient's son called EMS. No difficulty walking, no slurred speech. Patient was noted to have a right facial droop by his son and possibly got EMS. No history of CVA  ED Course:  Noncontrast CT of the head without acute abnormalities  Review of Systems: As per HPI, otherwise 10 point review of systems negative.    Past Medical History  Diagnosis Date  . Hypertension   . Stenosis of right subclavian artery   . BPH (benign prostatic hypertrophy)   . Arrhythmia   . Hyperlipidemia   . Diverticulosis   . Severe scoliosis   . Bilateral carotid artery stenosis 02/2008  . Hemorrhoids   . Sleep disorder   . Sleep apnea   . Essential hypertension 06/14/2015  . Hyperlipidemia 06/14/2015  . Subclavian artery stenosis, right 06/14/2015  . Scoliosis 06/14/2015  . OSA  (obstructive sleep apnea) 06/14/2015  . Pulmonary hypertension ([place]) 07/04/2015    Past Surgical History  Procedure Laterality Date  . Hemorrhoid surgery  09/10/12  . Tee without cardioversion N/A 07/13/2015    Procedure: TRANSESOPHAGEAL ECHOCARDIOGRAM (TEE);  Surgeon: [name], MD;  Location: [facility] ENDOSCOPY;  Service: Cardiovascular;  Laterality: N/A;     reports that he quit smoking about 66 years ago. His smoking use included Cigarettes. He has a 10 pack-year smoking history. He has never used smokeless tobacco. He reports that he drinks about 1.8 oz of alcohol per week. He reports that he does not use illicit drugs.  No Known Allergies  Family History  Problem Relation Age of Onset  . Prostate cancer Father   . Diabetes Sister   . Alzheimer's disease Mother     Prior to Admission medications   Medication Sig Start Date End Date Taking? Authorizing Provider  acetaminophen (TYLENOL) 500 MG tablet Take 500 mg by mouth every 6 (six) hours as needed for mild pain.   Yes Historical Provider, MD  amLODipine (NORVASC) 5 MG tablet 5 mg once. 07/16/15  Yes Historical Provider, MD  aspirin 81 MG tablet Take 81 mg by mouth daily.   Yes Historical Provider, MD  brimonidine-timolol (COMBIGAN) 0.2-0.5 % ophthalmic solution Place 1 drop into both eyes every 12 (twelve) hours.   Yes Historical Provider, MD  Calcium Carbonate-Vit D-Min (GNP CALCIUM PLUS 600 +D) 600-200 MG-UNIT TABS Take 1 tablet by mouth daily.   Yes Historical Provider, MD  carvedilol (COREG) 12.5 MG tablet Take 1 tablet (12.5  mg total) by mouth 2 (two) times daily. 09/27/15  Yes [name], MD  fish oil-omega-3 fatty acids 1000 MG capsule Take 1 g by mouth daily. With food   Yes Historical Provider, MD  furosemide (LASIX) 20 MG tablet Take 1 tablet (20 mg total) by mouth daily. 08/23/15  Yes Larey Dresser, MD  latanoprost (XALATAN) 0.005 % ophthalmic solution Place 1 drop into both eyes at bedtime.  07/31/12  Yes Historical  Provider, MD  losartan (COZAAR) 100 MG tablet 100 mg once. 07/07/15  Yes Historical Provider, MD  MULTIPLE VITAMIN PO Take 1 tablet by mouth daily.   Yes Historical Provider, MD  MYRBETRIQ 25 MG TB24 Take 25 mg by mouth daily.  07/12/12  Yes Historical Provider, MD  Tiotropium Bromide Monohydrate (SPIRIVA RESPIMAT) 2.5 MCG/ACT AERS Inhale 2 puffs into the lungs daily. 10/21/15  Yes Juanito Doom, MD  triamcinolone (KENALOG) 0.025 % cream Apply 1 application topically daily.  06/15/15  Yes Historical Provider, MD  vitamin C (ASCORBIC ACID) 500 MG tablet Take 500 mg by mouth daily.   Yes Historical Provider, MD    Physical Exam: Filed Vitals:   10/22/15 0845 10/22/15 0900 10/22/15 0936 10/22/15 0948  BP: 173/86 186/93 175/89   Pulse: 70 71  69  Temp:      TempSrc:      Resp: 19 20  17   Height:      Weight:      SpO2: 96% 97%  95%    Constitutional:  NAD, calm, comfortable Filed Vitals:   10/22/15 0845 10/22/15 0900 10/22/15 0936 10/22/15 0948  BP: 173/86 186/93 175/89   Pulse: 70 71  69  Temp:      TempSrc:      Resp: 19 20  17   Height:      Weight:      SpO2: 96% 97%  95%   Eyes: Pupils pinpoint, lids and conjunctivae normal ENMT: Mucous membranes are moist. Posterior pharynx clear of any exudate or lesions.Normal dentition.  Neck: normal, supple, no masses Respiratory: clear to auscultation bilaterally, no wheezing, no crackles. Normal respiratory effort. No accessory muscle use.  Cardiovascular: Regular rate and rhythm, no murmurs / rubs / gallops, pitting edema BLE, No extremity edema. 2+ pedal pulses.   Abdomen: no tenderness, no masses palpated. No hepatomegaly. Bowel sounds positive.  Musculoskeletal: no clubbing / cyanosis. No joint deformity upper and lower extremities. Good ROM, no contractures. Normal muscle tone.  Skin: no rashes, lesions, ulcers. No induration Neurologic: Alert and oriented, speech is not slurred . Follows commands . No obvious proptosis, slight  right facial droop, facial light touch sensation normal bilaterally, normal bilateral shoulder shrug, midline tongue extension  Psychiatric: Normal judgment and insight. Alert and oriented x 3. Normal mood.   Labs on Admission: I have personally reviewed following labs and imaging studies  CBC:  Recent Labs Lab 10/22/15 0811 10/22/15 0820  WBC 5.9  --   NEUTROABS 3.9  --   HGB 14.1 15.6  HCT 43.0 46.0  MCV 94.1  --   PLT 158  --    Basic Metabolic Panel:  Recent Labs Lab 10/22/15 0811 10/22/15 0820  NA 133* 134*  K 4.6 4.4  CL 94* 91*  CO2 29  --   GLUCOSE 89 85  BUN 12 16  CREATININE 0.68 0.80  CALCIUM 9.0  --    GFR: Estimated Creatinine Clearance: 57.1 mL/min (by C-G formula based on Cr of 0.8). Liver Function  Tests:  Recent Labs Lab 10/22/15 0811  AST 32  ALT 55  ALKPHOS 45  BILITOT 1.0  PROT 6.6  ALBUMIN 3.8  Coagulation Profile:  Recent Labs Lab 10/22/15 0811  INR 1.12    CBG:  Recent Labs Lab 10/22/15 0810  GLUCAP 90  Radiological Exams on Admission: Ct Head Wo Contrast  10/22/2015  CLINICAL DATA:  Right hand weakness. EXAM: CT HEAD WITHOUT CONTRAST TECHNIQUE: Contiguous axial images were obtained from the base of the skull through the vertex without intravenous contrast. COMPARISON:  None. FINDINGS: Calcifications along the falx and tentorium. No evidence for an intracranial hemorrhage. Areas of low-density in the subcortical white matter suggest chronic changes. No evidence for a large acute infarct, mass lesion, midline shift or hydrocephalus. Mucosal disease in the left frontal sinus and ethmoid air cells. Complete opacification of the left maxillary sinus. No calvarial fracture. IMPRESSION: No acute intracranial abnormality. Low-density in the white matter is suggestive for chronic small vessel ischemic changes. Paranasal sinus disease, most prominent in left maxillary sinus. Electronically Signed   By: Markus Daft M.D.   On: 10/22/2015 09:36      EKG: Independently reviewed.   EKG Interpretation  Date/Time:  Friday Oct 22 2015 08:01:58 EDT Ventricular Rate:  72 PR Interval:  194 QRS Duration: 86 QT Interval:  370 QTC Calculation: 405 R Axis:   29 Text Interpretation:  Sinus rhythm Abnormal R-wave progression, early transition No old tracing to compare Confirmed by RAY MD, Andee Poles [phone]) on 10/22/2015 8:05:03 AM Also confirmed by RAY MD, Andee Poles [phone]), editor [place]  CCT, [place] ([id number])  on 10/22/2015 8:08:37 AM      TEE 07/13/15: Study Conclusions  - Left ventricle: Systolic function was normal. Wall motion was normal; there were no regional wall motion abnormalities. - Aortic valve: There was trivial regurgitation. - Mitral valve: There was mild regurgitation. - Left atrium: No evidence of thrombus in the atrial cavity or appendage. No evidence of thrombus in the atrial cavity or appendage. - Right atrium: No evidence of thrombus or mass in the atrial cavity or appendage. - Atrial septum: There was a patent foramen ovale with right to left flow at rest and on saline microcavitation study.  Impressions:  - Severe pulmonary hypertenstion. PASP is at least 78 mmHg.   Assessment/Plan   Principal Problem:   Stroke-like episode ([place]) Active Problems:   Essential hypertension   Hyperlipidemia   OSA (obstructive sleep apnea)   Pulmonary hypertension (HCC)   Chronic diastolic CHF (congestive heart failure) (HCC)   COPD mixed type (HCC)  Stroke like symptoms. Presented with right hand weakness, right facial droop, both resolving. Suspect TIA. Head CT scan without acute findings.  -Place in observation -telemetry -Consult placed to Neurology- Dr. Janey Greaser  -TIA/stroke order set utilized  -Neuro checks -Last Echo Jan 2017 - grade I diastolic dysfunction, EF [id number] -Vascular ultrasound, MR brain, MRA head and brain  - per TIA / stroke order set - PT, OT, RN swallow study  History of chronic  diastolic heart failure. Ejection fraction 60-65%. Grade 1 diastolic dysfunction on echocardiogram January 2017. Pitting edema of both lower extremities on exam.  -Continue home Coreg, lasix, Cozaar  -Peripheral edema on exam, will give his home dose of lasix in IV form today . -Monitor I&O, daily weights -cxr now for dry cough  Sleep apnea. -CPAP machine  Severe pulmonary HTN, probably related to hypoxemia resulting from restrictive lung disease and possibly airflow obstruction.  -Continue O2 per  nasal cannula at 2 L -Outpatient chest CT scan scheduled for later this month by patient's pulmonologist  COPD, recently diagnosed.  Continue home Spiriva   Hypertension which has been pooly controlled.  170s / 90s today -Continue home Norvasc, Lasix, carvedilol and Cozaar                                     DVT prophylaxis:   Lovenox  Code Status:   Full code  Family Communication: Spoke with patient's 3 sons in the room one of which wa power of attorney [employer]. They understand and agree with observing patient overnight  Disposition Plan: Discharge home in 24-48 hours         Consults called: Neurology - Dr. Silverio Decamp called by EDP Admission status:  Observation -  telemetry    Tye Savoy NP [facility] Pager [phone]  If 7PM-7AM, please contact night-coverage [email or web address] Password [email or web address]  10/22/2015, 10:27 AM

## 2015-10-22 NOTE — ED Provider Notes (Signed)
CSN: [id number]     Arrival date & time 10/22/15  0758 History   First MD Initiated Contact with Patient 10/22/15 [phone]     Chief Complaint  Patient presents with  . Stroke Symptoms     (Consider location/radiation/quality/duration/timing/severity/associated sxs/prior Treatment) HPI   82 year old male last known normal proximally 9:30 last night although he awoke during the night and was able to go to the restroom he is unclear that time. He awoke this morning and is complaining of right facial droop and right hand weakness. He was brought to the hospital by EMS secondary to this. His son is present with him. Right facial droop appears to be resolving. There appears to be some ongoing right hand weakness per the patient. He denies any difficulty with vision, speech, or word finding. He was able to ambulate from his chair to the stretcher at home without difficulty.  Past Medical History  Diagnosis Date  . Hypertension   . Stenosis of right subclavian artery   . BPH (benign prostatic hypertrophy)   . Arrhythmia   . Hyperlipidemia   . Diverticulosis   . Severe scoliosis   . Bilateral carotid artery stenosis 02/2008  . Hemorrhoids   . Sleep disorder   . Sleep apnea   . Essential hypertension 06/14/2015  . Hyperlipidemia 06/14/2015  . Subclavian artery stenosis, right 06/14/2015  . Scoliosis 06/14/2015  . OSA (obstructive sleep apnea) 06/14/2015  . Pulmonary hypertension ([place]) 07/04/2015   Past Surgical History  Procedure Laterality Date  . Hemorrhoid surgery  09/10/12  . Tee without cardioversion N/A 07/13/2015    Procedure: TRANSESOPHAGEAL ECHOCARDIOGRAM (TEE);  Surgeon: [name], MD;  Location: [facility] ENDOSCOPY;  Service: Cardiovascular;  Laterality: N/A;   Family History  Problem Relation Age of Onset  . Prostate cancer Father   . Diabetes Sister   . Alzheimer's disease Mother    Social History  Substance Use Topics  . Smoking status: Former Smoker -- 1.00 packs/day for 10 years     Types: Cigarettes    Quit date: 06/05/1949  . Smokeless tobacco: Never Used  . Alcohol Use: 1.8 oz/week    3 Glasses of wine per week     Comment: Weekly.    Review of Systems  All other systems reviewed and are negative.     Allergies  Review of patient's allergies indicates no known allergies.  Home Medications   Prior to Admission medications   Medication Sig Start Date End Date Taking? Authorizing Provider  acetaminophen (TYLENOL) 500 MG tablet Take 500 mg by mouth every 6 (six) hours as needed for mild pain.   Yes Historical Provider, MD  amLODipine (NORVASC) 5 MG tablet 5 mg once. 07/16/15  Yes Historical Provider, MD  aspirin 81 MG tablet Take 81 mg by mouth daily.   Yes Historical Provider, MD  brimonidine-timolol (COMBIGAN) 0.2-0.5 % ophthalmic solution Place 1 drop into both eyes every 12 (twelve) hours.   Yes Historical Provider, MD  Calcium Carbonate-Vit D-Min (GNP CALCIUM PLUS 600 +D) 600-200 MG-UNIT TABS Take 1 tablet by mouth daily.   Yes Historical Provider, MD  carvedilol (COREG) 12.5 MG tablet Take 1 tablet (12.5 mg total) by mouth 2 (two) times daily. 09/27/15  Yes [name], MD  fish oil-omega-3 fatty acids 1000 MG capsule Take 1 g by mouth daily. With food   Yes Historical Provider, MD  furosemide (LASIX) 20 MG tablet Take 1 tablet (20 mg total) by mouth daily. 08/23/15  Yes Larey Dresser,  MD  latanoprost (XALATAN) 0.005 % ophthalmic solution Place 1 drop into both eyes at bedtime.  07/31/12  Yes Historical Provider, MD  losartan (COZAAR) 100 MG tablet 100 mg once. 07/07/15  Yes Historical Provider, MD  MULTIPLE VITAMIN PO Take 1 tablet by mouth daily.   Yes Historical Provider, MD  MYRBETRIQ 25 MG TB24 Take 25 mg by mouth daily.  07/12/12  Yes Historical Provider, MD  Tiotropium Bromide Monohydrate (SPIRIVA RESPIMAT) 2.5 MCG/ACT AERS Inhale 2 puffs into the lungs daily. 10/21/15  Yes Juanito Doom, MD  triamcinolone (KENALOG) 0.025 % cream Apply 1  application topically daily.  06/15/15  Yes Historical Provider, MD  vitamin C (ASCORBIC ACID) 500 MG tablet Take 500 mg by mouth daily.   Yes Historical Provider, MD   BP 186/93 mmHg  Pulse 71  Temp(Src) 99 F (37.2 C) (Oral)  Resp 20  Ht 5\' 9"  (1.753 m)  Wt 65.772 kg  BMI 21.40 kg/m2  SpO2 97% Physical Exam  Constitutional: He is oriented to person, place, and time. He appears well-developed and well-nourished.  HENT:  Head: Normocephalic and atraumatic.  Right Ear: External ear normal.  Left Ear: External ear normal.  Nose: Nose normal.  Mouth/Throat: Oropharynx is clear and moist.  Eyes: Conjunctivae and EOM are normal. Pupils are equal, round, and reactive to light.  Neck: Normal range of motion. Neck supple.  Cardiovascular: Normal rate, regular rhythm, normal heart sounds and intact distal pulses.   Pulmonary/Chest: Effort normal and breath sounds normal. No respiratory distress. He has no wheezes. He exhibits no tenderness.  Abdominal: Soft. Bowel sounds are normal. He exhibits no distension and no mass. There is no tenderness. There is no guarding.  Musculoskeletal: Normal range of motion.  Neurological: He is alert and oriented to person, place, and time. He has normal reflexes. He exhibits normal muscle tone. Coordination normal.  Mild right facial droop No palmar drift noted. Appears to be some intrinsic hand muscle weakness. Bilateral lower extremities are equal and able to be lifted against gravity No upper or lower extremity ataxia noted  Skin: Skin is warm and dry.  Psychiatric: He has a normal mood and affect. His behavior is normal. Judgment and thought content normal.  Nursing note and vitals reviewed.   ED Course  Procedures (including critical care time) Labs Review Labs Reviewed  COMPREHENSIVE METABOLIC PANEL - Abnormal; Notable for the following:    Sodium 133 (*)    Chloride 94 (*)    All other components within normal limits  I-STAT CHEM 8, ED -  Abnormal; Notable for the following:    Sodium 134 (*)    Chloride 91 (*)    Calcium, Ion 1.11 (*)    All other components within normal limits  PROTIME-INR  APTT  CBC  DIFFERENTIAL  I-STAT TROPOININ, ED  CBG MONITORING, ED    Imaging Review Ct Head Wo Contrast  10/22/2015  CLINICAL DATA:  Right hand weakness. EXAM: CT HEAD WITHOUT CONTRAST TECHNIQUE: Contiguous axial images were obtained from the base of the skull through the vertex without intravenous contrast. COMPARISON:  None. FINDINGS: Calcifications along the falx and tentorium. No evidence for an intracranial hemorrhage. Areas of low-density in the subcortical white matter suggest chronic changes. No evidence for a large acute infarct, mass lesion, midline shift or hydrocephalus. Mucosal disease in the left frontal sinus and ethmoid air cells. Complete opacification of the left maxillary sinus. No calvarial fracture. IMPRESSION: No acute intracranial abnormality. Low-density in  the white matter is suggestive for chronic small vessel ischemic changes. Paranasal sinus disease, most prominent in left maxillary sinus. Electronically Signed   By: Markus Daft M.D.   On: 10/22/2015 09:36   I have personally reviewed and evaluated these images and lab results as part of my medical decision-making.   EKG Interpretation   Date/Time:  Friday Oct 22 2015 08:01:58 EDT Ventricular Rate:  72 PR Interval:  194 QRS Duration: 86 QT Interval:  370 QTC Calculation: 405 R Axis:   29 Text Interpretation:  Sinus rhythm Abnormal R-wave progression, early  transition No old tracing to compare Confirmed by Orin Eberwein MD, Andee Poles [id number])  on 10/22/2015 8:05:03 AM Also confirmed by Analiz Tvedt MD, Emilija Bohman [phone]), editor  [place]  CCT, [place] ([id number])  on 10/22/2015 8:08:37 AM      MDM   Final diagnoses:  Right hand weakness    Patient with symptoms consistent with CVA. Symptoms appeared to be improving but have not completely resolved. No evidence for  acute intracranial hemorrhage, large acute infarct, or mass lesion. Patient with last known normal time at 9:30 last night. Subsequently, neurolo   consult obtained and pending. I discussed the patient's care with Dr. Silverio Decamp on call for neurology. I discussed the patient's care with Nevin Bloodgood on call for hospitalist.  Pattricia Boss, MD 10/22/15 1018

## 2015-10-22 NOTE — Progress Notes (Signed)
Received call from patient's nurse. Patient's sons obtained copy of living will from patient's home as re requested. Patient wishes to be a DO NOT RESUSCITATE so will change code status

## 2015-10-22 NOTE — ED Notes (Signed)
Patient transported to CT 

## 2015-10-23 DIAGNOSIS — E785 Hyperlipidemia, unspecified: Secondary | ICD-10-CM | POA: Diagnosis not present

## 2015-10-23 DIAGNOSIS — I272 Other secondary pulmonary hypertension: Secondary | ICD-10-CM

## 2015-10-23 DIAGNOSIS — G4733 Obstructive sleep apnea (adult) (pediatric): Secondary | ICD-10-CM | POA: Diagnosis not present

## 2015-10-23 DIAGNOSIS — G5631 Lesion of radial nerve, right upper limb: Secondary | ICD-10-CM

## 2015-10-23 DIAGNOSIS — I10 Essential (primary) hypertension: Secondary | ICD-10-CM | POA: Diagnosis not present

## 2015-10-23 DIAGNOSIS — I639 Cerebral infarction, unspecified: Secondary | ICD-10-CM | POA: Diagnosis not present

## 2015-10-23 LAB — BASIC METABOLIC PANEL
Anion gap: 8 (ref 5–15)
BUN: 14 mg/dL (ref 6–20)
CALCIUM: 8.8 mg/dL — AB (ref 8.9–10.3)
CHLORIDE: 91 mmol/L — AB (ref 101–111)
CO2: 36 mmol/L — ABNORMAL HIGH (ref 22–32)
CREATININE: 0.79 mg/dL (ref 0.61–1.24)
GFR calc Af Amer: >60 mL/min (ref >60)
GFR calc non Af Amer: >60 mL/min (ref >60)
Glucose, Bld: 94 mg/dL (ref 65–99)
Potassium: 4.8 mmol/L (ref 3.5–5.1)
SODIUM: 135 mmol/L (ref 135–145)

## 2015-10-23 LAB — LIPID PANEL
CHOLESTEROL: 140 mg/dL (ref 0–200)
HDL: 42 mg/dL (ref >40)
LDL CALC: 85 mg/dL (ref 0–99)
Total CHOL/HDL Ratio: 3.3 RATIO
Triglycerides: 64 mg/dL (ref <150)
VLDL: 13 mg/dL (ref 0–40)

## 2015-10-23 LAB — CBC
HCT: 42.7 % (ref 39.0–52.0)
HEMOGLOBIN: 13.5 g/dL (ref 13.0–17.0)
MCH: 30.5 pg (ref 26.0–34.0)
MCHC: 31.6 g/dL (ref 30.0–36.0)
MCV: 96.4 fL (ref 78.0–100.0)
Platelets: 162 10*3/uL (ref 150–400)
RBC: 4.43 MIL/uL (ref 4.22–5.81)
RDW: 14 % (ref 11.5–15.5)
WBC: 7 10*3/uL (ref 4.0–10.5)

## 2015-10-23 MED ORDER — CARVEDILOL 3.125 MG PO TABS
3.1250 mg | ORAL_TABLET | Freq: Two times a day (BID) | ORAL | Status: DC
  Administered 2015-10-23 – 2015-10-25 (×4): 3.125 mg via ORAL
  Filled 2015-10-23 (×4): qty 1

## 2015-10-23 MED ORDER — SODIUM CHLORIDE 0.9 % IV SOLN
INTRAVENOUS | Status: AC
  Administered 2015-10-23: 17:00:00 via INTRAVENOUS

## 2015-10-23 NOTE — Procedures (Signed)
Pt refuses to wear cpap will inform RT if anything changes. Pt is stable at this time , RT will continue to monitor.

## 2015-10-23 NOTE — Progress Notes (Signed)
PROGRESS NOTE    Pedro Hall.  [id number] DOB: February 15, 1924 DOA: 10/22/2015 PCP: Mathews Argyle, MD   Outpatient Specialists:     Brief Narrative:  Pedro Hall. is a 82 y.o. male with medical history significant for but not necessarily limited to, Hypertension, sleep apnea, pulmonary HTN, and carotid artery stenosis.  Patient had an initial consult with Pulmonologist yesterday for abnormal pulmonary function studies, SOB. Patient hypoxic on arrival to office. as 81%. Overall picture felt to be c/w COPD, likely severe restrictive lung disease based on his kyphosis. Prescribed Spiriva, 0xygen at night and scheduled for sleep study with oximetry.  Patient present to ED today for evaluation of right-sided weakness. He was last known normal at 9:30, spoke on the telephone with someone. Early this morning patient called son stating that he was having difficulty buttoning his shirt or putting on pants. His right hand was weak, movements uncoordinated. No right lower extremity weakness. Son notes that initially patient seemed a little confused over the telephone but that subsided. Patient's son called EMS. No difficulty walking, no slurred speech. Patient was noted to have a right facial droop by his son and possibly got EMS. No history of CVA   Assessment & Plan:   Principal Problem:   Stroke-like episode ([place]) Active Problems:   Essential hypertension   Hyperlipidemia   OSA (obstructive sleep apnea)   Pulmonary hypertension (HCC)   Chronic diastolic CHF (congestive heart failure) (HCC)   COPD mixed type (HCC)   Stroke-like symptoms   Radial nerve palsy causing wrist drop -brace -PT -no sign of stroke on MRI  Hypotension -check BP each arm -gentle IVF -on has coreg today-- decrease dose- hold other meds -no sign of fever  OSA -bipap at night  Severe pulmonary HTN, probably related to hypoxemia resulting from restrictive lung disease and possibly  airflow obstruction.  -Continue O2 per nasal cannula at 2 L -Outpatient chest CT scan scheduled for later this month by patient's pulmonologist   DVT prophylaxis:  Lovenox   Code Status: DNR   Family Communication: Sons at bedside  Disposition Plan:  SNF at [facility]   Consultants:   neuro  Procedures:        Subjective: No complaints  Objective: Filed Vitals:   10/23/15 0929 10/23/15 1343 10/23/15 1358 10/23/15 1400  BP: 101/57 84/39 80/46  85/50  Pulse: 70 55    Temp: 97.9 F (36.6 C) 98.1 F (36.7 C)    TempSrc: Oral Oral    Resp: 19 19    Height:      Weight:      SpO2: 92% 95%      Intake/Output Summary (Last 24 hours) at 10/23/15 1528 Last data filed at 10/22/15 2125  Gross per 24 hour  Intake      0 ml  Output    501 ml  Net   -501 ml   Filed Weights   10/22/15 0801 10/22/15 1451  Weight: 65.772 kg (145 lb) 64.048 kg (141 lb 3.2 oz)    Examination:  General exam: Appears calm and comfortable  Respiratory system: diminished, respiratory effort normal Cardiovascular system: S1 & S2 heard, RRR. No JVD, murmurs, rubs, gallops or clicks. No pedal edema. Gastrointestinal system: Abdomen is nondistended, soft and nontender. No organomegaly or masses felt. Normal bowel sounds heard. Central nervous system: pleasant, hard of hearing     Data Reviewed: I have personally reviewed following labs and imaging studies  CBC:  Recent Labs Lab  10/22/15 0811 10/22/15 0820 10/23/15 0538  WBC 5.9  --  7.0  NEUTROABS 3.9  --   --   HGB 14.1 15.6 13.5  HCT 43.0 46.0 42.7  MCV 94.1  --  96.4  PLT 158  --  0000000   Basic Metabolic Panel:  Recent Labs Lab 10/22/15 0811 10/22/15 0820 10/23/15 0538  NA 133* 134* 135  K 4.6 4.4 4.8  CL 94* 91* 91*  CO2 29  --  36*  GLUCOSE 89 85 94  BUN 12 16 14   CREATININE 0.68 0.80 0.79  CALCIUM 9.0  --  8.8*   GFR: Estimated Creatinine Clearance: 55.6 mL/min (by C-G formula based on Cr of  0.79). Liver Function Tests:  Recent Labs Lab 10/22/15 0811  AST 32  ALT 55  ALKPHOS 45  BILITOT 1.0  PROT 6.6  ALBUMIN 3.8   No results for input(s): LIPASE, AMYLASE in the last 168 hours. No results for input(s): AMMONIA in the last 168 hours. Coagulation Profile:  Recent Labs Lab 10/22/15 0811  INR 1.12   Cardiac Enzymes: No results for input(s): CKTOTAL, CKMB, CKMBINDEX, TROPONINI in the last 168 hours. BNP (last 3 results) No results for input(s): PROBNP in the last 8760 hours. HbA1C: No results for input(s): HGBA1C in the last 72 hours. CBG:  Recent Labs Lab 10/22/15 0810  GLUCAP 90   Lipid Profile:  Recent Labs  10/23/15 0538  CHOL 140  HDL 42  LDLCALC 85  TRIG 64  CHOLHDL 3.3   Thyroid Function Tests: No results for input(s): TSH, T4TOTAL, FREET4, T3FREE, THYROIDAB in the last 72 hours. Anemia Panel: No results for input(s): VITAMINB12, FOLATE, FERRITIN, TIBC, IRON, RETICCTPCT in the last 72 hours. Urine analysis: No results found for: COLORURINE, APPEARANCEUR, LABSPEC, [place], GLUCOSEU, HGBUR, BILIRUBINUR, KETONESUR, PROTEINUR, UROBILINOGEN, NITRITE, LEUKOCYTESUR   )No results found for this or any previous visit (from the past 240 hour(s)).    Anti-infectives    None       Radiology Studies: Dg Chest 2 View  10/22/2015  CLINICAL DATA:  Cough, hypertension, former smoker, pulmonary hypertension EXAM: CHEST  2 VIEW COMPARISON:  07/27/2015 FINDINGS: Marked chronic elevation of LEFT diaphragm. Normal heart size and pulmonary vascularity. Calcified tortuous thoracic aorta. LEFT basilar atelectasis again seen. Remaining lungs clear. No definite pleural effusion or pneumothorax. Bones diffusely demineralized. IMPRESSION: Chronic elevation of LEFT diaphragm with LEFT basilar atelectasis. Electronically Signed   By: Lavonia Dana M.D.   On: 10/22/2015 11:55   Ct Head Wo Contrast  10/22/2015  CLINICAL DATA:  Right hand weakness. EXAM: CT HEAD WITHOUT  CONTRAST TECHNIQUE: Contiguous axial images were obtained from the base of the skull through the vertex without intravenous contrast. COMPARISON:  None. FINDINGS: Calcifications along the falx and tentorium. No evidence for an intracranial hemorrhage. Areas of low-density in the subcortical white matter suggest chronic changes. No evidence for a large acute infarct, mass lesion, midline shift or hydrocephalus. Mucosal disease in the left frontal sinus and ethmoid air cells. Complete opacification of the left maxillary sinus. No calvarial fracture. IMPRESSION: No acute intracranial abnormality. Low-density in the white matter is suggestive for chronic small vessel ischemic changes. Paranasal sinus disease, most prominent in left maxillary sinus. Electronically Signed   By: Markus Daft M.D.   On: 10/22/2015 09:36   Mr Brain Wo Contrast  10/22/2015  CLINICAL DATA:  Right facial droop and right hand weakness. EXAM: MRI HEAD WITHOUT CONTRAST MRA HEAD WITHOUT CONTRAST TECHNIQUE: Multiplanar, multiecho  pulse sequences of the brain and surrounding structures were obtained without intravenous contrast. Angiographic images of the head were obtained using MRA technique without contrast. COMPARISON:  Head CT 10/22/2015 FINDINGS: MRI HEAD FINDINGS There is no evidence of acute infarct, mass, midline shift, or extra-axial fluid collection. Chronic microhemorrhages are noted in the right temporal lobe and left frontal operculum. There is moderate cerebral atrophy. Patchy T2 hyperintensities in the cerebral white matter greatest in the periventricular regions and nonspecific but compatible with mild-to-moderate chronic small vessel ischemic disease. There is a chronic lacunar infarct in the left thalamus. Prior bilateral cataract extraction is noted. There is chronic left maxillary sinusitis with complete sinus opacification. Partial left frontal and bilateral ethmoid sinus opacification is also noted. The mastoid air cells are  clear. Major intracranial vascular flow voids are preserved with the left vertebral artery dominant. The visualized portion of the cervical spine demonstrates multilevel disc degeneration with evidence of spinal stenosis and mild spinal cord flattening at C2-3 and C3-4, incompletely evaluated. MRA HEAD FINDINGS The study is mildly motion degraded. The visualized distal left vertebral artery is widely patent and dominant. The distal right vertebral artery is hypoplastic. The basilar arteries widely patent. SCA origins are patent. Posterior communicating arteries are small or absent. PCAs are patent with mild branch vessel irregularity but no significant proximal stenosis. The internal carotid arteries are patent from skullbase to carotid termini without stenosis. The cavernous segments are mildly ectatic bilaterally, left greater than right. The left A1 segment is dominant. The right A1 segment is hypoplastic with assessment for stenosis limited by its small size and motion artifact through this region. There is a slightly bulbous appearance of the anterior communicating artery region, however assessment for a discrete aneurysm is limited by motion. MCAs are patent without evidence of significant M1 stenosis. There is mild bilateral MCA branch vessel irregularity. IMPRESSION: 1. No acute intracranial abnormality. 2. Mild to moderate chronic small vessel ischemic disease. Moderate cerebral atrophy. 3. Motion degraded head MRA without evidence of major vessel occlusion or flow limiting proximal stenosis. Electronically Signed   By: Logan Bores M.D.   On: 10/22/2015 13:27   Mr Jodene Nam Head/brain Wo Cm  10/22/2015  CLINICAL DATA:  Right facial droop and right hand weakness. EXAM: MRI HEAD WITHOUT CONTRAST MRA HEAD WITHOUT CONTRAST TECHNIQUE: Multiplanar, multiecho pulse sequences of the brain and surrounding structures were obtained without intravenous contrast. Angiographic images of the head were obtained using MRA  technique without contrast. COMPARISON:  Head CT 10/22/2015 FINDINGS: MRI HEAD FINDINGS There is no evidence of acute infarct, mass, midline shift, or extra-axial fluid collection. Chronic microhemorrhages are noted in the right temporal lobe and left frontal operculum. There is moderate cerebral atrophy. Patchy T2 hyperintensities in the cerebral white matter greatest in the periventricular regions and nonspecific but compatible with mild-to-moderate chronic small vessel ischemic disease. There is a chronic lacunar infarct in the left thalamus. Prior bilateral cataract extraction is noted. There is chronic left maxillary sinusitis with complete sinus opacification. Partial left frontal and bilateral ethmoid sinus opacification is also noted. The mastoid air cells are clear. Major intracranial vascular flow voids are preserved with the left vertebral artery dominant. The visualized portion of the cervical spine demonstrates multilevel disc degeneration with evidence of spinal stenosis and mild spinal cord flattening at C2-3 and C3-4, incompletely evaluated. MRA HEAD FINDINGS The study is mildly motion degraded. The visualized distal left vertebral artery is widely patent and dominant. The distal right vertebral artery is  hypoplastic. The basilar arteries widely patent. SCA origins are patent. Posterior communicating arteries are small or absent. PCAs are patent with mild branch vessel irregularity but no significant proximal stenosis. The internal carotid arteries are patent from skullbase to carotid termini without stenosis. The cavernous segments are mildly ectatic bilaterally, left greater than right. The left A1 segment is dominant. The right A1 segment is hypoplastic with assessment for stenosis limited by its small size and motion artifact through this region. There is a slightly bulbous appearance of the anterior communicating artery region, however assessment for a discrete aneurysm is limited by motion.  MCAs are patent without evidence of significant M1 stenosis. There is mild bilateral MCA branch vessel irregularity. IMPRESSION: 1. No acute intracranial abnormality. 2. Mild to moderate chronic small vessel ischemic disease. Moderate cerebral atrophy. 3. Motion degraded head MRA without evidence of major vessel occlusion or flow limiting proximal stenosis. Electronically Signed   By: Logan Bores M.D.   On: 10/22/2015 13:27        Scheduled Meds: . aspirin EC  81 mg Oral Daily  . brimonidine  1 drop Both Eyes Q12H   And  . timolol  1 drop Both Eyes Q12H  . carvedilol  3.125 mg Oral BID  . enoxaparin (LOVENOX) injection  40 mg Subcutaneous Q24H  . latanoprost  1 drop Both Eyes QHS  . tiotropium  18 mcg Inhalation Daily   Continuous Infusions: . sodium chloride          Time spent: 35 min    [place], DO [facility] Pager [phone]  If 7PM-7AM, please contact night-coverage [email or web address] Password [email or web address] 10/23/2015, 3:28 PM

## 2015-10-23 NOTE — Evaluation (Signed)
Physical Therapy Evaluation Patient Details Name: Pedro Hall. MRN: [id number] DOB: August 30, 1923 Today's Date: 10/23/2015   History of Present Illness  82 y.o. male with medical history significant for but not necessarily limited to, Hypertension, sleep apnea, pulmonary HTN, and carotid artery stenosis. Presented to the ED on 5/19 for R sided weakness. MRI on 5/19 neg for acute intracranial abnormality.      Clinical Impression  Pt admitted with above diagnosis. Pt currently with functional limitations due to the deficits listed below (see PT Problem List). Pt currently requires min assist for safe transfers and ambulation due to impaired balance.  SpO2 dropped as low as 75% during session (see general notes below for more detail).  Given pt's current functional status and limited assist available at home (lives alone), recommending SNF at d/c, pt and two son's in agreement. Pt will benefit from skilled PT to increase their independence and safety with mobility to allow discharge to the venue listed below.      Follow Up Recommendations SNF;Supervision for mobility/OOB    Equipment Recommendations  Other (comment) (TBD at next venue of care)    Recommendations for Other Services       Precautions / Restrictions Precautions Precautions: Fall Precaution Comments: monitor O2 Restrictions Weight Bearing Restrictions: No      Mobility  Bed Mobility               General bed mobility comments: Pt sitting in recliner chair upon PT arrival  Transfers Overall transfer level: Needs assistance Equipment used: Rolling walker (2 wheeled) Transfers: Sit to/from Stand Sit to Stand: Min guard         General transfer comment: Cues for hand placement using RW.  Flexed posture which pt is unable to correct (h/o scoliosis)  Ambulation/Gait Ambulation/Gait assistance: Min assist Ambulation Distance (Feet): 80 Feet Assistive device: Rolling walker (2 wheeled);None Gait  Pattern/deviations: Step-through pattern;Decreased stride length;Staggering left;Staggering right;Trunk flexed   Gait velocity interpretation: Below normal speed for age/gender General Gait Details: Trialed RW but pt w/ poor management, pushing it too far ahead placing him at greater risk for falls.  Pt staggers Lt/Rt occassionally, requiring min assist to steady.  When pt stops he reaches out for railing on wall for support.  SpO2 dropped to 88% on 4L O2 despite pt focusing on pursed lip breathing.  Stairs            Wheelchair Mobility    Modified Rankin (Stroke Patients Only)       Balance Overall balance assessment: Needs assistance Sitting-balance support: No upper extremity supported;Feet supported Sitting balance-Leahy Scale: Good     Standing balance support: During functional activity;Single extremity supported Standing balance-Leahy Scale: Fair Standing balance comment: Pt reaching out for rail during static standing after exertion.                             Pertinent Vitals/Pain Pain Assessment: No/denies pain    Home Living Family/patient expects to be discharged to:: Skilled nursing facility Living Arrangements: Alone Available Help at Discharge: Family;Available PRN/intermittently Type of Home: House Home Access: Level entry     Home Layout: One level Home Equipment: Grab bars - toilet;Grab bars - tub/shower;Shower seat - built in (Life alert) Additional Comments: Lives at [facility]. Son contacted SW at [facility] to see if pt could stay on SNF side of facility for a few days prior to returning home; pt in agreement.  Prior Function Level of Independence: Independent         Comments: Pt completes all ADLs/mobility independently. Facility provides dinner and does cleaning every other week. Pt still drives.  Pt does report 1 fall in the past 6 months.  Occurred when transfering from commode to chair and was alone.        Hand Dominance   Dominant Hand: Right    Extremity/Trunk Assessment   Upper Extremity Assessment: Defer to OT evaluation           Lower Extremity Assessment: RLE deficits/detail RLE Deficits / Details: DF 4/5    Cervical / Trunk Assessment: Kyphotic  Communication   Communication: HOH  Cognition Arousal/Alertness: Awake/alert Behavior During Therapy: WFL for tasks assessed/performed Overall Cognitive Status: Within Functional Limits for tasks assessed                      General Comments General comments (skin integrity, edema, etc.): Pt's two sons report that pt has been told he needs to wear O2 at home; however pt has not done so.  SpO2 noted to drop to 88% quickly when briefly off oxygen, 3L O2 applied w/ SpO2 reaching as low as 75% despite pursed lip breathing.  Increased O2 to 4L for remainder of session and back down to 3L once session complete.    Exercises General Exercises - Lower Extremity Ankle Circles/Pumps: AROM;Both;10 reps;Seated      Assessment/Plan    PT Assessment Patient needs continued PT services  PT Diagnosis Difficulty walking;Generalized weakness   PT Problem List Decreased strength;Decreased activity tolerance;Decreased balance;Decreased knowledge of use of DME;Decreased safety awareness;Cardiopulmonary status limiting activity;Decreased knowledge of precautions  PT Treatment Interventions DME instruction;Gait training;Functional mobility training;Therapeutic activities;Therapeutic exercise;Balance training;Patient/family education   PT Goals (Current goals can be found in the Care Plan section) Acute Rehab PT Goals Patient Stated Goal: Return to being independent PT Goal Formulation: With patient/family Time For Goal Achievement: 11/06/15 Potential to Achieve Goals: Good    Frequency Min 3X/week   Barriers to discharge Decreased caregiver support lives alone    Co-evaluation               End of Session Equipment  Utilized During Treatment: Gait belt;Oxygen Activity Tolerance: Treatment limited secondary to medical complications (Comment);Patient limited by fatigue (hypoxia) Patient left: in chair;with call bell/phone within reach;with chair alarm set;with family/visitor present Nurse Communication: Mobility status;Other (comment) (SpO2)    Functional Assessment Tool Used: Clinical Judgement Functional Limitation: Mobility: Walking and moving around Mobility: Walking and Moving Around Current Status [phone]): At least 20 percent but less than 40 percent impaired, limited or restricted Mobility: Walking and Moving Around Goal Status [phone]): At least 1 percent but less than 20 percent impaired, limited or restricted    Time: 1451-1518 PT Time Calculation (min) (ACUTE ONLY): 27 min   Charges:   PT Evaluation $PT Eval Low Complexity: 1 Procedure PT Treatments $Gait Training: 8-22 mins   PT G Codes:   PT G-Codes **NOT FOR INPATIENT CLASS** Functional Assessment Tool Used: Clinical Judgement Functional Limitation: Mobility: Walking and moving around Mobility: Walking and Moving Around Current Status [id number]): At least 20 percent but less than 40 percent impaired, limited or restricted Mobility: Walking and Moving Around Goal Status [phone]): At least 1 percent but less than 20 percent impaired, limited or restricted   Collie Siad PT, DPT  Pager: [phone] Phone: [phone] 10/23/2015, 4:23 PM

## 2015-10-23 NOTE — Evaluation (Signed)
Occupational Therapy Evaluation Patient Details Name: Pedro Hall. MRN: [id number] DOB: [birth year] Today's Date: 10/23/2015    History of Present Illness 82 y.o. male with medical history significant for but not necessarily limited to, Hypertension, sleep apnea, pulmonary HTN, and carotid artery stenosis. Presented to the ED on 5/19 for R sided weakness. MRI on 5/19 neg for acute intracranial abnormality.     Clinical Impression   Pt reports he was independent with ADLs and mobility PTA. Currently pt overall min assist for ADLs and stand pivot transfers requiring hand held assist for balance. Pt noted to have R wrist weakness; difficulty with full extension and limited strength against gravity. Pt and son report suspected wrist drop/radial nerve palsy. Discussed symptoms with DO; ordered splint for R wrist drop via [employer]. Discussed d/c plan with pt and son; son reports that he has contacted SW from [facility] about pt staying on SNF side of facility for a few days prior to returning home alone; in agreement with short term SNF to maximize independence and safety with ADLs and functional mobility prior to return home. Pt would benefit from continued skilled OT to address established goals.  Upon arrival, pt on 3L O2 via nasal canula; SpO2=mid 80s. O2 removed in supine prior to activity; SpO2=low 80s. O2 reapplied for transfer; SpO2=90 in sitting following activity with 3L O2. Son reports they are working on getting pt set up with home O2.     Follow Up Recommendations  SNF;Supervision - Intermittent    Equipment Recommendations  Other (comment) (TBD)    Recommendations for Other Services PT consult     Precautions / Restrictions Restrictions Weight Bearing Restrictions: No      Mobility Bed Mobility Overal bed mobility: Needs Assistance Bed Mobility: Supine to Sit     Supine to sit: Min assist     General bed mobility comments: Pt pulling from supine to sit with  OTs hand.   Transfers Overall transfer level: Needs assistance Equipment used: 1 person hand held assist Transfers: Sit to/from [facility] Sit to Stand: Min guard Stand pivot transfers: Min assist       General transfer comment: Min hand held assist for stand pivot transfer. VCs to push from bed for sit to stand.    Balance Overall balance assessment: Needs assistance Sitting-balance support: No upper extremity supported;Feet supported Sitting balance-Leahy Scale: Good     Standing balance support: Bilateral upper extremity supported Standing balance-Leahy Scale: Fair                              ADL Overall ADL's : Needs assistance/impaired Eating/Feeding: Set up;Sitting   Grooming: Set up;Sitting;Supervision/safety   Upper Body Bathing: Set up;Supervision/ safety;Sitting   Lower Body Bathing: Minimal assistance;Sit to/from stand   Upper Body Dressing : Supervision/safety;Set up;Sitting   Lower Body Dressing: Minimal assistance;Sit to/from stand   Toilet Transfer: Minimal Production assistant, radio Details (indicate cue type and reason): Simulated by transfer from EOB to chair Toileting- Clothing Manipulation and Hygiene: Minimal assistance;Sit to/from stand       Functional mobility during ADLs: Minimal assistance (for stand pivot only) General ADL Comments: Pt on 3L O2 via nasal canula; SpO2=mid 80s. O2 removed in supine prior to activity; SpO2=low 80s. O2 reapplied for transfer; SpO2=90 in sitting with 3L O2.      Vision Vision Assessment?: No apparent visual deficits   Perception     Praxis  Pertinent Vitals/Pain Pain Assessment: No/denies pain     Hand Dominance Right   Extremity/Trunk Assessment Upper Extremity Assessment Upper Extremity Assessment: RUE deficits/detail RUE Deficits / Details: R wrist drop noted. Pt with minimal active extension against gravity (able to acheive neutral); 3/5 MMT  wrist extension. 5/5 wrist flexion and full AROM.    Lower Extremity Assessment Lower Extremity Assessment: Defer to PT evaluation   Cervical / Trunk Assessment Cervical / Trunk Assessment: Kyphotic   Communication Communication Communication: HOH   Cognition Arousal/Alertness: Awake/alert Behavior During Therapy: WFL for tasks assessed/performed Overall Cognitive Status: Within Functional Limits for tasks assessed                     General Comments       Exercises       Shoulder Instructions      Home Living Family/patient expects to be discharged to:: Unsure Living Arrangements: Alone Available Help at Discharge: Family;Available PRN/intermittently Type of Home: House Home Access: Level entry     Home Layout: One level     Bathroom Shower/Tub: Occupational psychologist: Standard     Home Equipment: Grab bars - toilet;Grab bars - tub/shower;Shower seat - built in   Additional Comments: Lives at [facility]. Son contacted SW at [facility] to see if pt could stay on SNF side of facility for a few days prior to returning home; pt in agreement.      Prior Functioning/Environment Level of Independence: Independent        Comments: Pt completes all ADLs/mobility independently. Facility provides dinner and does cleaning every other week. Pt still drives.    OT Diagnosis: Generalized weakness;Hemiplegia dominant side   OT Problem List: Decreased strength;Decreased range of motion;Impaired balance (sitting and/or standing);Decreased knowledge of use of DME or AE;Decreased knowledge of precautions;Impaired UE functional use   OT Treatment/Interventions: Therapeutic exercise;Energy conservation;DME and/or AE instruction;Self-care/ADL training;Therapeutic activities;Patient/family education;Balance training    OT Goals(Current goals can be found in the care plan section) Acute Rehab OT Goals Patient Stated Goal: Return to being  independent OT Goal Formulation: With patient/family Time For Goal Achievement: 11/06/15 Potential to Achieve Goals: Good ADL Goals Pt Will Perform Grooming: with modified independence;standing Pt Will Perform Upper Body Bathing: with modified independence;sitting;standing Pt Will Perform Lower Body Bathing: with modified independence;sit to/from stand Pt Will Transfer to Toilet: with modified independence;ambulating;regular height toilet Pt Will Perform Toileting - Clothing Manipulation and hygiene: with modified independence;sit to/from stand Pt/caregiver will Perform Home Exercise Program: Increased ROM;Increased strength;Right Upper extremity;Independently (exercises for R wrist drop)  OT Frequency: Min 2X/week   Barriers to D/C: Decreased caregiver support  Pt lives alone       Co-evaluation              End of Session Equipment Utilized During Treatment: Oxygen Nurse Communication: Mobility status;Other (comment) (Spoke with DO about ordering splint for R wrist drop)  Activity Tolerance: Patient tolerated treatment well Patient left: in chair;with call bell/phone within reach;with chair alarm set;with family/visitor present   Time: [id number] OT Time Calculation (min): 33 min Charges:  OT General Charges $OT Visit: 1 Procedure OT Evaluation $OT Eval Moderate Complexity: 1 Procedure OT Treatments $Therapeutic Activity: 8-22 mins G-Codes: OT G-codes **NOT FOR INPATIENT CLASS** Functional Assessment Tool Used: Clinical judgement Functional Limitation: Self care Self Care Current Status [id number]): At least 1 percent but less than 20 percent impaired, limited or restricted Self Care Goal Status [id number]): At least 1  percent but less than 20 percent impaired, limited or restricted   Binnie Kand M.S., OTR/L Pager: [phone]  10/23/2015, 10:44 AM

## 2015-10-23 NOTE — Progress Notes (Signed)
Orthopedic Tech Progress Note Patient Details:  Pedro Hall [birth year] 82 [id number]  Ortho Devices Type of Ortho Device: Velcro wrist forearm splint Ortho Device/Splint Interventions: Application   Pedro Hall 10/23/2015, 2:12 PM

## 2015-10-24 DIAGNOSIS — I272 Other secondary pulmonary hypertension: Secondary | ICD-10-CM | POA: Diagnosis not present

## 2015-10-24 DIAGNOSIS — E785 Hyperlipidemia, unspecified: Secondary | ICD-10-CM

## 2015-10-24 DIAGNOSIS — I639 Cerebral infarction, unspecified: Secondary | ICD-10-CM | POA: Diagnosis not present

## 2015-10-24 DIAGNOSIS — G5630 Lesion of radial nerve, unspecified upper limb: Secondary | ICD-10-CM

## 2015-10-24 DIAGNOSIS — G4733 Obstructive sleep apnea (adult) (pediatric): Secondary | ICD-10-CM | POA: Diagnosis not present

## 2015-10-24 LAB — BASIC METABOLIC PANEL
ANION GAP: 7 (ref 5–15)
BUN: 20 mg/dL (ref 6–20)
CO2: 34 mmol/L — AB (ref 22–32)
Calcium: 8.5 mg/dL — ABNORMAL LOW (ref 8.9–10.3)
Chloride: 90 mmol/L — ABNORMAL LOW (ref 101–111)
Creatinine, Ser: 0.62 mg/dL (ref 0.61–1.24)
GFR calc non Af Amer: >60 mL/min (ref >60)
GLUCOSE: 92 mg/dL (ref 65–99)
POTASSIUM: 4.8 mmol/L (ref 3.5–5.1)
Sodium: 131 mmol/L — ABNORMAL LOW (ref 135–145)

## 2015-10-24 LAB — CBC
HEMATOCRIT: 40 % (ref 39.0–52.0)
HEMOGLOBIN: 13 g/dL (ref 13.0–17.0)
MCH: 31 pg (ref 26.0–34.0)
MCHC: 32.5 g/dL (ref 30.0–36.0)
MCV: 95.5 fL (ref 78.0–100.0)
Platelets: 155 10*3/uL (ref 150–400)
RBC: 4.19 MIL/uL — AB (ref 4.22–5.81)
RDW: 13.6 % (ref 11.5–15.5)
WBC: 7.4 10*3/uL (ref 4.0–10.5)

## 2015-10-24 NOTE — Progress Notes (Signed)
PROGRESS NOTE    Pedro Hall.  [id number] DOB: December 20, 1923 DOA: 10/22/2015 PCP: Mathews Argyle, MD   Outpatient Specialists:     Brief Narrative:  Pedro Hall. is a 82 y.o. male with medical history significant for but not necessarily limited to, Hypertension, sleep apnea, pulmonary HTN, and carotid artery stenosis.  Patient had an initial consult with Pulmonologist yesterday for abnormal pulmonary function studies, SOB. Patient hypoxic on arrival to office. as 81%. Overall picture felt to be c/w COPD, likely severe restrictive lung disease based on his kyphosis. Prescribed Spiriva, 0xygen at night and scheduled for sleep study with oximetry.  Patient present to ED today for evaluation of right-sided weakness. He was last known normal at 9:30, spoke on the telephone with someone. Early this morning patient called son stating that he was having difficulty buttoning his shirt or putting on pants. His right hand was weak, movements uncoordinated. No right lower extremity weakness. Son notes that initially patient seemed a little confused over the telephone but that subsided. Patient's son called EMS. No difficulty walking, no slurred speech. Patient was noted to have a right facial droop by his son and possibly got EMS. No history of CVA   Assessment & Plan:   Principal Problem:   Stroke-like episode ([place]) Active Problems:   Essential hypertension   Hyperlipidemia   OSA (obstructive sleep apnea)   Pulmonary hypertension (HCC)   Chronic diastolic CHF (congestive heart failure) (HCC)   COPD mixed type (HCC)   Stroke-like symptoms   Radial nerve palsy causing wrist drop -brace -PT -no sign of stroke on MRI  Hypotension -resolved with gentle IVF (270ml) -adjusted BP medications -monitor closely as outpatient  OSA -bipap at night-encouraged compliance  Severe pulmonary HTN, probably related to hypoxemia resulting from restrictive lung disease and  possibly airflow obstruction.  -Continue O2 per nasal cannula at 2-3 L -Outpatient chest CT scan scheduled for later this month by patient's pulmonologist   DVT prophylaxis:  Lovenox   Code Status: DNR   Family Communication: Sons at bedside  Disposition Plan:  SNF at [facility] in AM once have insurance approval   Consultants:   neuro  Procedures:        Subjective: No complaints- thinks he is moving wrist better  Objective: Filed Vitals:   10/23/15 2100 10/24/15 0126 10/24/15 0505 10/24/15 0926  BP: 153/68 149/66 139/69 137/61  Pulse: 73 69 67 72  Temp: 98.3 F (36.8 C) 98 F (36.7 C) 98.5 F (36.9 C) 98.3 F (36.8 C)  TempSrc: Oral Oral Oral Oral  Resp: 20 18 18 18   Height:      Weight:      SpO2: 95% 96% 96% 93%    Intake/Output Summary (Last 24 hours) at 10/24/15 1328 Last data filed at 10/23/15 2103  Gross per 24 hour  Intake    120 ml  Output      0 ml  Net    120 ml   Filed Weights   10/22/15 0801 10/22/15 1451  Weight: 65.772 kg (145 lb) 64.048 kg (141 lb 3.2 oz)    Examination:  General exam: Appears calm and comfortable  Respiratory system: diminished, respiratory effort normal Cardiovascular system: S1 & S2 heard, RRR. No JVD, murmurs, rubs, gallops or clicks. No pedal edema. Gastrointestinal system: Abdomen is nondistended, soft and nontender. No organomegaly or masses felt. Normal bowel sounds heard. Central nervous system: pleasant, hard of hearing     Data Reviewed: I have  personally reviewed following labs and imaging studies  CBC:  Recent Labs Lab 10/22/15 0811 10/22/15 0820 10/23/15 0538 10/24/15 0624  WBC 5.9  --  7.0 7.4  NEUTROABS 3.9  --   --   --   HGB 14.1 15.6 13.5 13.0  HCT 43.0 46.0 42.7 40.0  MCV 94.1  --  96.4 95.5  PLT 158  --  162 [id number]   Basic Metabolic Panel:  Recent Labs Lab 10/22/15 0811 10/22/15 0820 10/23/15 0538 10/24/15 0624  NA 133* 134* 135 131*  K 4.6 4.4 4.8 4.8  CL 94* 91*  91* 90*  CO2 29  --  36* 34*  GLUCOSE 89 85 94 92  BUN 12 16 14 20   CREATININE 0.68 0.80 0.79 0.62  CALCIUM 9.0  --  8.8* 8.5*   GFR: Estimated Creatinine Clearance: 55.6 mL/min (by C-G formula based on Cr of 0.62). Liver Function Tests:  Recent Labs Lab 10/22/15 0811  AST 32  ALT 55  ALKPHOS 45  BILITOT 1.0  PROT 6.6  ALBUMIN 3.8   No results for input(s): LIPASE, AMYLASE in the last 168 hours. No results for input(s): AMMONIA in the last 168 hours. Coagulation Profile:  Recent Labs Lab 10/22/15 0811  INR 1.12   Cardiac Enzymes: No results for input(s): CKTOTAL, CKMB, CKMBINDEX, TROPONINI in the last 168 hours. BNP (last 3 results) No results for input(s): PROBNP in the last 8760 hours. HbA1C: No results for input(s): HGBA1C in the last 72 hours. CBG:  Recent Labs Lab 10/22/15 0810  GLUCAP 90   Lipid Profile:  Recent Labs  10/23/15 0538  CHOL 140  HDL 42  LDLCALC 85  TRIG 64  CHOLHDL 3.3   Thyroid Function Tests: No results for input(s): TSH, T4TOTAL, FREET4, T3FREE, THYROIDAB in the last 72 hours. Anemia Panel: No results for input(s): VITAMINB12, FOLATE, FERRITIN, TIBC, IRON, RETICCTPCT in the last 72 hours. Urine analysis: No results found for: COLORURINE, APPEARANCEUR, LABSPEC, [place], GLUCOSEU, HGBUR, BILIRUBINUR, KETONESUR, PROTEINUR, UROBILINOGEN, NITRITE, LEUKOCYTESUR   )No results found for this or any previous visit (from the past 240 hour(s)).    Anti-infectives    None       Radiology Studies: No results found.      Scheduled Meds: . aspirin EC  81 mg Oral Daily  . brimonidine  1 drop Both Eyes Q12H   And  . timolol  1 drop Both Eyes Q12H  . carvedilol  3.125 mg Oral BID  . enoxaparin (LOVENOX) injection  40 mg Subcutaneous Q24H  . latanoprost  1 drop Both Eyes QHS  . tiotropium  18 mcg Inhalation Daily   Continuous Infusions:        Time spent: 35 min    [place], DO [facility] Pager  [phone]  If 7PM-7AM, please contact night-coverage [email or web address] Password [email or web address] 10/24/2015, 1:28 PM

## 2015-10-24 NOTE — Clinical Social Work Note (Signed)
Clinical Social Work Assessment  Patient Details  Name: Pedro Hall. MRN: [id number] Date of Birth: 1923-08-05  Date of referral:  10/24/15               Reason for consult:  Facility Placement                Permission sought to share information with:  Facility Sport and exercise psychologist, Family Supports Permission granted to share information::  Yes, Verbal Permission Granted  Name::     Pedro Hall [phone] or [phone] or [place],Dr. Herbie [place] [phone]  Agency::  SNF admissions  Relationship::     Contact Information:     Housing/Transportation Living arrangements for the past 2 months:  [place] of Information:  Patient, Adult Children Patient Interpreter Needed:  None Criminal Activity/Legal Involvement Pertinent to Current Situation/Hospitalization:  No - Comment as needed Significant Relationships:  Adult Children Lives with:  Self, Facility Resident Do you feel safe going back to the place where you live?  No (Patient feels she needs short term rehab before she is able to return back home.) Need for family participation in patient care:  Yes (Comment) (Patient is requesting his son's to help with decision making.)  Care giving concerns:  Patient feels he needs some short term rehab before he is able to return back to his independent living.   Social Worker assessment / plan:  Patient is a 82 year old male who lives at [facility] living facility.  Patient is alert and oriented x4 and able to express his feelings.  Patient's son was at bedside, patient reports he has been at SNF in the past and is familiar with what to expect.  Patient states he is aware of SNF placement process, CSW reminded patient and his son on what to expect at SNF for short term rehab.  Patient and son were informed of what to expect day of discharge to SNF and how insurance will pay for his stay at [facility].  Patient and son did  not express any more questions.  Employment status:  Retired Nurse, adult PT Recommendations:  [place] / Referral to community resources:     Patient/Family's Response to care:  Patient and family in agreement to going to SNF for short term rehab. Patient/Family's Understanding of and Emotional Response to Diagnosis, Current Treatment, and Prognosis: Patient and family aware of current diagnosis and treatment plan.  Emotional Assessment Appearance:  Appears stated age Attitude/Demeanor/Rapport:    Affect (typically observed):  Appropriate, Calm, Pleasant, Stable Orientation:  Oriented to Self, Oriented to Place, Oriented to  Time, Oriented to Situation Alcohol / Substance use:  Not Applicable Psych involvement (Current and /or in the community):  No (Comment)  Discharge Needs  Concerns to be addressed:  Lack of Support Readmission within the last 30 days:  No Current discharge risk:  Lack of support system, Lives alone Barriers to Discharge:  Insurance Authorization   Anell Barr 10/24/2015, 2:50 PM

## 2015-10-24 NOTE — NC FL2 (Signed)
[place] LEVEL OF CARE SCREENING TOOL     IDENTIFICATION  Patient Name: Pedro Hall. Birthdate: 1924-04-19 Sex: male Admission Date (Current Location): 10/22/2015  [facility] and [place] Number:  Herbalist and Address:  [facility] [place], [place] [address] [place], [place] [address]      Provider Number: [id number]  Attending Physician Name and Address:  Geradine Girt, DO  Relative Name and Phone Number:  [place],Dr. Corleone Cogdill [phone] or [place], [place] Son [phone]    Current Level of Care: Hospital Recommended Level of Care: [place] Prior Approval Number:    Date Approved/Denied:   PASRR Number: [id number]  Discharge Plan: SNF    Current Diagnoses: Patient Active Problem List   Diagnosis Date Noted  . Stroke-like episode ([place]) 10/22/2015  . Stroke-like symptoms 10/22/2015  . Cough   . Chronic respiratory failure with hypoxia ([place]) 10/21/2015  . COPD mixed type ([place]) 10/21/2015  . Chronic diastolic CHF (congestive heart failure) ([place]) 08/23/2015  . Pulmonary hypertension ([place]) 07/04/2015  . Essential hypertension 06/14/2015  . Hyperlipidemia 06/14/2015  . Subclavian artery stenosis, right 06/14/2015  . Scoliosis 06/14/2015  . OSA (obstructive sleep apnea) 06/14/2015    Orientation RESPIRATION BLADDER Height & Weight     Self, Situation, Place, Time  O2 (3L per minute oxygen) Continent Weight: 141 lb 3.2 oz (64.048 kg) Height:  5\' 9"  (175.3 cm)  BEHAVIORAL SYMPTOMS/MOOD NEUROLOGICAL BOWEL NUTRITION STATUS      Continent Diet (Carb modified)  AMBULATORY STATUS COMMUNICATION OF NEEDS Skin   Limited Assist Verbally Normal                       Personal Care Assistance Level of Assistance  Bathing, Dressing Bathing Assistance: Limited assistance   Dressing Assistance: Limited assistance     Functional Limitations Info             SPECIAL CARE FACTORS  FREQUENCY  PT (By licensed PT), OT (By licensed OT)     PT Frequency: 5x a week OT Frequency: 5x a week            Contractures      Additional Factors Info  Code Status, Allergies Code Status Info: DNR Allergies Info: NKA           Current Medications (10/24/2015):  This is the current hospital active medication list Current Facility-Administered Medications  Medication Dose Route Frequency Provider Last Rate Last Dose  . acetaminophen (TYLENOL) tablet 650 mg  650 mg Oral Q4H PRN Willia Craze, NP   650 mg at 10/24/15 0256   Or  . acetaminophen (TYLENOL) suppository 650 mg  650 mg Rectal Q4H PRN Willia Craze, NP      . aspirin EC tablet 81 mg  81 mg Oral Daily Willia Craze, NP   81 mg at 10/24/15 1005  . brimonidine (ALPHAGAN) 0.2 % ophthalmic solution 1 drop  1 drop Both Eyes Q12H Kimberly B Hammons, RPH   1 drop at 10/24/15 1006   And  . timolol (TIMOPTIC) 0.5 % ophthalmic solution 1 drop  1 drop Both Eyes Q12H Kimberly B Hammons, RPH   1 drop at 10/24/15 1006  . carvedilol (COREG) tablet 3.125 mg  3.125 mg Oral BID Geradine Girt, DO   3.125 mg at 10/24/15 1005  . enoxaparin (LOVENOX) injection 40 mg  40 mg Subcutaneous Q24H Willia Craze, NP  40 mg at 10/23/15 2108  . latanoprost (XALATAN) 0.005 % ophthalmic solution 1 drop  1 drop Both Eyes QHS Willia Craze, NP   1 drop at 10/23/15 2328  . senna-docusate (Senokot-S) tablet 1 tablet  1 tablet Oral QHS PRN Willia Craze, NP      . tiotropium [facility]) inhalation capsule 18 mcg  18 mcg Inhalation Daily Albertine Patricia, MD   18 mcg at 10/24/15 0911     Discharge Medications: Please see discharge summary for a list of discharge medications.  Relevant Imaging Results:  Relevant Lab Results:   Additional Information SSN [id number]  Ross Ludwig, [place]

## 2015-10-24 NOTE — Clinical Social Work Placement (Signed)
   CLINICAL SOCIAL WORK PLACEMENT  NOTE  Date:  10/24/2015  Patient Details  Name: Pedro Hall. MRN: [id number] Date of Birth: 08-08-1923  Clinical Social Work is seeking post-discharge placement for this patient at the [place] level of care (*CSW will initial, date and re-position this form in  chart as items are completed):  Yes   Patient/family provided with [place] Work Department's list of facilities offering this level of care within the geographic area requested by the patient (or if unable, by the patient's family).  Yes   Patient/family informed of their freedom to choose among providers that offer the needed level of care, that participate in Medicare, Medicaid or managed care program needed by the patient, have an available bed and are willing to accept the patient.  Yes   Patient/family informed of [place]'s ownership interest in [facility] and [facility], as well as of the fact that they are under no obligation to receive care at these facilities.  PASRR submitted to EDS on 10/24/15     PASRR number received on       Existing PASRR number confirmed on 10/24/15     FL2 transmitted to all facilities in geographic area requested by pt/family on 10/24/15     FL2 transmitted to all facilities within larger geographic area on       Patient informed that his/her managed care company has contracts with or will negotiate with certain facilities, including the following:        Yes   Patient/family informed of bed offers received.  Patient chooses bed at [facility]     Physician recommends and patient chooses bed at      Patient to be transferred to   on  .  Patient to be transferred to facility by       Patient family notified on   of transfer.  Name of family member notified:        PHYSICIAN Please sign FL2, Please sign DNR     Additional Comment:     _______________________________________________ Ross Ludwig, LCSWA 10/24/2015, 3:44 PM

## 2015-10-24 NOTE — Progress Notes (Signed)
Occupational Therapy Treatment Patient Details Name: Pedro Hall. MRN: [id number] DOB: 05/15/1924 Today's Date: 10/24/2015    History of present illness 82 y.o. male with medical history significant for but not necessarily limited to, Hypertension, sleep apnea, pulmonary HTN, and carotid artery stenosis. Presented to the ED on 5/19 for R sided weakness. MRI on 5/19 neg for acute intracranial abnormality.     OT comments  Focus of session on R hand strengthening and wrist drop exercises. Pt tolerated squeeze ball and R wrist exercises well (see Other Exercises section). Pt reports he feels like R hand is stronger than yesterday; able to self feed better today. D/c plan remains appropriate. Will continue to follow acutely.   Follow Up Recommendations  SNF;Supervision - Intermittent    Equipment Recommendations  Other (comment) (TBD)    Recommendations for Other Services      Precautions / Restrictions Precautions Precautions: Fall Precaution Comments: monitor O2 Restrictions Weight Bearing Restrictions: No       Mobility Bed Mobility               General bed mobility comments: Pt OOB in chair upon arrival.  Transfers                 General transfer comment: Not assessed at this time.    Balance                                   ADL                                         General ADL Comments: Focus of session on R hand strengthening and wrist drop exercises. Pt reports he feels like R UE is getting stronger; able to self feed better today.      Vision                     Perception     Praxis      Cognition   Behavior During Therapy: [facility] for tasks assessed/performed Overall Cognitive Status: Within Functional Limits for tasks assessed                       Extremity/Trunk Assessment               Exercises Other Exercises Other Exercises: R hand squeeze ball x10 without brace,  x10 with brace. Other Exercises: R wrist AAROM flex/ext with support to wrist, x10 reps. Other Exercises: R digit ab/adduction x10.   Shoulder Instructions       General Comments      Pertinent Vitals/ Pain       Pain Assessment: No/denies pain  Home Living                                          Prior Functioning/Environment              Frequency Min 2X/week     Progress Toward Goals  OT Goals(current goals can now be found in the care plan section)  Progress towards OT goals: Progressing toward goals  Acute Rehab OT Goals Patient Stated Goal: Return to being independent OT Goal Formulation: With patient  Plan Discharge plan remains appropriate    Co-evaluation                 End of Session Equipment Utilized During Treatment: Other (comment) (R wrist splint)   Activity Tolerance Patient tolerated treatment well   Patient Left in chair;with call bell/phone within reach   Nurse Communication          Time: [id number] OT Time Calculation (min): 18 min  Charges: OT General Charges $OT Visit: 1 Procedure OT Treatments $Therapeutic Exercise: 8-22 mins  Binnie Kand M.S., OTR/L Pager: [phone]  10/24/2015, 3:52 PM

## 2015-10-24 NOTE — Progress Notes (Signed)
CM called CSW Randall Hiss to notify of discharge need back to [facility]; Randall Hiss is aware and arranging.  No other CM needs were communicated.

## 2015-10-24 NOTE — Progress Notes (Signed)
Pt refuses CPAP for the night. RT will continue to monitor.  

## 2015-10-25 DIAGNOSIS — I639 Cerebral infarction, unspecified: Secondary | ICD-10-CM | POA: Diagnosis not present

## 2015-10-25 DIAGNOSIS — I10 Essential (primary) hypertension: Secondary | ICD-10-CM | POA: Diagnosis not present

## 2015-10-25 DIAGNOSIS — I272 Other secondary pulmonary hypertension: Secondary | ICD-10-CM | POA: Diagnosis not present

## 2015-10-25 DIAGNOSIS — G4733 Obstructive sleep apnea (adult) (pediatric): Secondary | ICD-10-CM | POA: Diagnosis not present

## 2015-10-25 LAB — HEMOGLOBIN A1C
HEMOGLOBIN A1C: 5.5 % (ref 4.8–5.6)
MEAN PLASMA GLUCOSE: 111 mg/dL

## 2015-10-25 MED ORDER — CARVEDILOL 6.25 MG PO TABS: 6.2500 mg | ORAL_TABLET | Freq: Two times a day (BID) | ORAL | Status: AC

## 2015-10-25 MED ORDER — CARVEDILOL 6.25 MG PO TABS: 6.2500 mg | ORAL_TABLET | Freq: Two times a day (BID) | ORAL | Status: DC

## 2015-10-25 NOTE — Clinical Social Work Placement (Signed)
   CLINICAL SOCIAL WORK PLACEMENT  NOTE  Date:  10/25/2015  Patient Details  Name: Pedro Hall. MRN: [id number] Date of Birth: 08/01/1923  Clinical Social Work is seeking post-discharge placement for this patient at the [place] level of care (*CSW will initial, date and re-position this form in  chart as items are completed):  Yes   Patient/family provided with [place] Work Department's list of facilities offering this level of care within the geographic area requested by the patient (or if unable, by the patient's family).  Yes   Patient/family informed of their freedom to choose among providers that offer the needed level of care, that participate in Medicare, Medicaid or managed care program needed by the patient, have an available bed and are willing to accept the patient.  Yes   Patient/family informed of [facility]'s ownership interest in [facility] and [facility], as well as of the fact that they are under no obligation to receive care at these facilities.  PASRR submitted to EDS on 10/24/15     PASRR number received on       Existing PASRR number confirmed on 10/24/15     FL2 transmitted to all facilities in geographic area requested by pt/family on 10/24/15     FL2 transmitted to all facilities within larger geographic area on       Patient informed that his/her managed care company has contracts with or will negotiate with certain facilities, including the following:        Yes   Patient/family informed of bed offers received.  Patient chooses bed at  [facility] )     Physician recommends and patient chooses bed at      Patient to be transferred to  [facility] ) on 10/25/15.  Patient to be transferred to facility by  [facility] )     Patient family notified on 10/25/15 of transfer.  Name of family member notified:   (Pt's son, Coralyn Mark and Bohdi )     PHYSICIAN Please sign FL2, Please sign DNR,  Please prepare priority discharge summary, including medications     Additional Comment:    _______________________________________________ Rozell Searing, LCSW 10/25/2015, 10:50 AM

## 2015-10-25 NOTE — Care Management Obs Status (Signed)
[facility] NOTIFICATION   Patient Details  Name: Pedro Hall. MRN: [id number] Date of Birth: 05-Mar-1924   Medicare Observation Status Notification Given:  Yes (MRI results negative)    Pollie Friar, RN 10/25/2015, 2:31 PM

## 2015-10-25 NOTE — Care Management Note (Signed)
Case Management Note  Patient Details  Name: Pedro Hall. MRN: [id number] Date of Birth: 01/16/1924  Subjective/Objective:                    Action/Plan: Plan is for patient to discharge to SNF portion of [facility]. No further needs per CM.   Expected Discharge Date:                  Expected Discharge Plan:  Skilled Nursing Facility  In-House Referral:  Clinical Social Work  Discharge planning Services  CM Consult  Post Acute Care Choice:    Choice offered to:     DME Arranged:    DME Agency:     HH Arranged:    [place] Agency:     Status of Service:  Completed, signed off  Medicare Important Message Given:    Date Medicare IM Given:    Medicare IM give by:    Date Additional Medicare IM Given:    Additional Medicare Important Message give by:     If discussed at [place] of Stay Meetings, dates discussed:    Additional Comments:  Pollie Friar, RN 10/25/2015, 11:24 AM

## 2015-10-25 NOTE — Clinical Social Work Note (Signed)
BLUE Medicare auth obtained [id number] RVB next review 5/24.  Clinical Social Worker facilitated patient discharge including contacting patient family and facility to confirm patient discharge plans.  Clinical information faxed to facility and family agreeable with plan.  CSW arranged ambulance transport via [facility] to [facility].  RN to call report prior to discharge.  Clinical Social Worker will sign off for now as social work intervention is no longer needed. Please consult [place] again if new need arises.  Glendon Axe, MSW, LCSWA [phone] 10/25/2015 4:06 PM

## 2015-10-25 NOTE — Discharge Summary (Addendum)
Physician Discharge Summary  Pedro Hall. [id number] DOB: 1924/04/06 DOA: 10/22/2015  PCP: Mathews Argyle, MD  Admit date: 10/22/2015 Discharge date: 10/25/2015   Recommendations for Outpatient Follow-Up:   Have adjusted BP meds-- when patient received all meds, he became hypotensive-- watch BP and add back cautiously 2-3L O2 DNR CPAP at night-- due for repeat study-- at that time may need different mask to help with compliance BMP 1 week Right wrist brace -Outpatient chest CT scan scheduled for later this month by patient's pulmonologist already     Discharge Diagnosis:   Principal Problem:   Stroke-like episode ([place]) Active Problems:   Essential hypertension   Hyperlipidemia   OSA (obstructive sleep apnea)   Pulmonary hypertension (H[facility])   Chronic diastolic CHF (congestive heart failure) ([place])   COPD mixed type ([place])   Stroke-like symptoms   Radial nerve palsy   Discharge disposition:  SNF:  Discharge Condition: Improved.  Diet recommendation: Low sodium, heart healthy  Wound care: None.   History of Present Illness:   Pedro Hall. is a 82 y.o. male with medical history significant for but not necessarily limited to, Hypertension, sleep apnea, pulmonary HTN, and carotid artery stenosis.  Patient had an initial consult with Pulmonologist yesterday for abnormal pulmonary function studies, SOB. Patient hypoxic on arrival to office. as 81%. Overall picture felt to be c/w COPD, likely severe restrictive lung disease based on his kyphosis. Prescribed Spiriva, 0ogen at night and scheduled for sleep study with oximetry.  Patient present to ED today for evaluation of right-sided weakness. He was last known normal at 9:30, spoke on the telephone with someone. Early this morning patient called son stating that he was having difficulty buttoning his shirt or putting on pants. His right hand was weak, movements uncoordinated. No right lower  extremity weakness. Son notes that initially patient seemed a little confused over the telephone but that subsided. Patient's son called EMS. No difficulty walking, no slurred speech. Patient was noted to have a right facial droop by his son and possibly got EMS. No history of CVA   Hospital Course by Problem:   Radial nerve palsy causing wrist drop-- much improved already -brace -PT -no sign of stroke on MRI  Hypotension -resolved with gentle IVF (251ml) -adjusted BP medications -monitor closely as outpatient- BP does trend higher at times  OSA -bipap at night-encouraged compliance  Severe pulmonary HTN, probably related to hypoxemia resulting from restrictive lung disease and possibly airflow obstruction.  -Continue O2 per nasal cannula at 2-3 L -Outpatient chest CT scan scheduled for later this month by patient's pulmonologist    Medical Consultants:    Neurology   Discharge Exam:   Filed Vitals:   10/25/15 0530 10/25/15 0955  BP: 139/68 154/70  Pulse: 79 70  Temp: 98.3 F (36.8 C) 97.9 F (36.6 C)  Resp: 16 20   Filed Vitals:   10/24/15 2103 10/25/15 0130 10/25/15 0530 10/25/15 0955  BP: 152/69 147/62 139/68 154/70  Pulse: 73 78 79 70  Temp: 98.1 F (36.7 C) 98.6 F (37 C) 98.3 F (36.8 C) 97.9 F (36.6 C)  TempSrc: Oral Oral Oral Oral  Resp: 18 18 16 20   Height:      Weight:      SpO2: 96% 96% 95% 90%    Gen:  NAD- pleasant on 3L currently    The results of significant diagnostics from this hospitalization (including imaging, microbiology, ancillary and laboratory) are listed below for reference.  Procedures and Diagnostic Studies:   Dg Chest 2 View  10/22/2015  CLINICAL DATA:  Cough, hypertension, former smoker, pulmonary hypertension EXAM: CHEST  2 VIEW COMPARISON:  07/27/2015 FINDINGS: Marked chronic elevation of LEFT diaphragm. Normal heart size and pulmonary vascularity. Calcified tortuous thoracic aorta. LEFT basilar atelectasis again  seen. Remaining lungs clear. No definite pleural effusion or pneumothorax. Bones diffusely demineralized. IMPRESSION: Chronic elevation of LEFT diaphragm with LEFT basilar atelectasis. Electronically Signed   By: Lavonia Dana M.D.   On: 10/22/2015 11:55   Ct Head Wo Contrast  10/22/2015  CLINICAL DATA:  Right hand weakness. EXAM: CT HEAD WITHOUT CONTRAST TECHNIQUE: Contiguous axial images were obtained from the base of the skull through the vertex without intravenous contrast. COMPARISON:  None. FINDINGS: Calcifications along the falx and tentorium. No evidence for an intracranial hemorrhage. Areas of low-density in the subcortical white matter suggest chronic changes. No evidence for a large acute infarct, mass lesion, midline shift or hydrocephalus. Mucosal disease in the left frontal sinus and ethmoid air cells. Complete opacification of the left maxillary sinus. No calvarial fracture. IMPRESSION: No acute intracranial abnormality. Low-density in the white matter is suggestive for chronic small vessel ischemic changes. Paranasal sinus disease, most prominent in left maxillary sinus. Electronically Signed   By: Markus Daft M.D.   On: 10/22/2015 09:36   Mr Brain Wo Contrast  10/22/2015  CLINICAL DATA:  Right facial droop and right hand weakness. EXAM: MRI HEAD WITHOUT CONTRAST MRA HEAD WITHOUT CONTRAST TECHNIQUE: Multiplanar, multiecho pulse sequences of the brain and surrounding structures were obtained without intravenous contrast. Angiographic images of the head were obtained using MRA technique without contrast. COMPARISON:  Head CT 10/22/2015 FINDINGS: MRI HEAD FINDINGS There is no evidence of acute infarct, mass, midline shift, or extra-axial fluid collection. Chronic microhemorrhages are noted in the right temporal lobe and left frontal operculum. There is moderate cerebral atrophy. Patchy T2 hyperintensities in the cerebral white matter greatest in the periventricular regions and nonspecific but  compatible with mild-to-moderate chronic small vessel ischemic disease. There is a chronic lacunar infarct in the left thalamus. Prior bilateral cataract extraction is noted. There is chronic left maxillary sinusitis with complete sinus opacification. Partial left frontal and bilateral ethmoid sinus opacification is also noted. The mastoid air cells are clear. Major intracranial vascular flow voids are preserved with the left vertebral artery dominant. The visualized portion of the cervical spine demonstrates multilevel disc degeneration with evidence of spinal stenosis and mild spinal cord flattening at C2-3 and C3-4, incompletely evaluated. MRA HEAD FINDINGS The study is mildly motion degraded. The visualized distal left vertebral artery is widely patent and dominant. The distal right vertebral artery is hypoplastic. The basilar arteries widely patent. SCA origins are patent. Posterior communicating arteries are small or absent. PCAs are patent with mild branch vessel irregularity but no significant proximal stenosis. The internal carotid arteries are patent from skullbase to carotid termini without stenosis. The cavernous segments are mildly ectatic bilaterally, left greater than right. The left A1 segment is dominant. The right A1 segment is hypoplastic with assessment for stenosis limited by its small size and motion artifact through this region. There is a slightly bulbous appearance of the anterior communicating artery region, however assessment for a discrete aneurysm is limited by motion. MCAs are patent without evidence of significant M1 stenosis. There is mild bilateral MCA branch vessel irregularity. IMPRESSION: 1. No acute intracranial abnormality. 2. Mild to moderate chronic small vessel ischemic disease. Moderate cerebral atrophy. 3. Motion  degraded head MRA without evidence of major vessel occlusion or flow limiting proximal stenosis. Electronically Signed   By: Logan Bores M.D.   On: 10/22/2015  13:27   Mr Jodene Nam Head/brain Wo Cm  10/22/2015  CLINICAL DATA:  Right facial droop and right hand weakness. EXAM: MRI HEAD WITHOUT CONTRAST MRA HEAD WITHOUT CONTRAST TECHNIQUE: Multiplanar, multiecho pulse sequences of the brain and surrounding structures were obtained without intravenous contrast. Angiographic images of the head were obtained using MRA technique without contrast. COMPARISON:  Head CT 10/22/2015 FINDINGS: MRI HEAD FINDINGS There is no evidence of acute infarct, mass, midline shift, or extra-axial fluid collection. Chronic microhemorrhages are noted in the right temporal lobe and left frontal operculum. There is moderate cerebral atrophy. Patchy T2 hyperintensities in the cerebral white matter greatest in the periventricular regions and nonspecific but compatible with mild-to-moderate chronic small vessel ischemic disease. There is a chronic lacunar infarct in the left thalamus. Prior bilateral cataract extraction is noted. There is chronic left maxillary sinusitis with complete sinus opacification. Partial left frontal and bilateral ethmoid sinus opacification is also noted. The mastoid air cells are clear. Major intracranial vascular flow voids are preserved with the left vertebral artery dominant. The visualized portion of the cervical spine demonstrates multilevel disc degeneration with evidence of spinal stenosis and mild spinal cord flattening at C2-3 and C3-4, incompletely evaluated. MRA HEAD FINDINGS The study is mildly motion degraded. The visualized distal left vertebral artery is widely patent and dominant. The distal right vertebral artery is hypoplastic. The basilar arteries widely patent. SCA origins are patent. Posterior communicating arteries are small or absent. PCAs are patent with mild branch vessel irregularity but no significant proximal stenosis. The internal carotid arteries are patent from skullbase to carotid termini without stenosis. The cavernous segments are mildly ectatic  bilaterally, left greater than right. The left A1 segment is dominant. The right A1 segment is hypoplastic with assessment for stenosis limited by its small size and motion artifact through this region. There is a slightly bulbous appearance of the anterior communicating artery region, however assessment for a discrete aneurysm is limited by motion. MCAs are patent without evidence of significant M1 stenosis. There is mild bilateral MCA branch vessel irregularity. IMPRESSION: 1. No acute intracranial abnormality. 2. Mild to moderate chronic small vessel ischemic disease. Moderate cerebral atrophy. 3. Motion degraded head MRA without evidence of major vessel occlusion or flow limiting proximal stenosis. Electronically Signed   By: Logan Bores M.D.   On: 10/22/2015 13:27     Labs:   Basic Metabolic Panel:  Recent Labs Lab 10/22/15 0811 10/22/15 0820 10/23/15 0538 10/24/15 0624  NA 133* 134* 135 131*  K 4.6 4.4 4.8 4.8  CL 94* 91* 91* 90*  CO2 29  --  36* 34*  GLUCOSE 89 85 94 92  BUN 12 16 14 20   CREATININE 0.68 0.80 0.79 0.62  CALCIUM 9.0  --  8.8* 8.5*   GFR Estimated Creatinine Clearance: 55.6 mL/min (by C-G formula based on Cr of 0.62). Liver Function Tests:  Recent Labs Lab 10/22/15 0811  AST 32  ALT 55  ALKPHOS 45  BILITOT 1.0  PROT 6.6  ALBUMIN 3.8   No results for input(s): LIPASE, AMYLASE in the last 168 hours. No results for input(s): AMMONIA in the last 168 hours. Coagulation profile  Recent Labs Lab 10/22/15 0811  INR 1.12    CBC:  Recent Labs Lab 10/22/15 0811 10/22/15 0820 10/23/15 0538 10/24/15 0624  WBC 5.9  --  7.0 7.4  NEUTROABS 3.9  --   --   --   HGB 14.1 15.6 13.5 13.0  HCT 43.0 46.0 42.7 40.0  MCV 94.1  --  96.4 95.5  PLT 158  --  162 155   Cardiac Enzymes: No results for input(s): CKTOTAL, CKMB, CKMBINDEX, TROPONINI in the last 168 hours. BNP: Invalid input(s): POCBNP CBG:  Recent Labs Lab 10/22/15 0810  GLUCAP 90    D-Dimer No results for input(s): DDIMER in the last 72 hours. Hgb A1c  Recent Labs  10/23/15 0538  HGBA1C 5.5   Lipid Profile  Recent Labs  10/23/15 0538  CHOL 140  HDL 42  LDLCALC 85  TRIG 64  CHOLHDL 3.3   Thyroid function studies No results for input(s): TSH, T4TOTAL, T3FREE, THYROIDAB in the last 72 hours.  Invalid input(s): FREET3 Anemia work up No results for input(s): VITAMINB12, FOLATE, FERRITIN, TIBC, IRON, RETICCTPCT in the last 72 hours. Microbiology No results found for this or any previous visit (from the past 240 hour(s)).   Discharge Instructions:   Discharge Instructions    Diet - low sodium heart healthy    Complete by:  As directed      Discharge instructions    Complete by:  As directed   Have adjusted BP meds-- when patient received all meds, he became hypotensive-- watch BP and add back cautiously 2-3L O2 DNR     Increase activity slowly    Complete by:  As directed             Medication List    STOP taking these medications        amLODipine 5 MG tablet  Commonly known as:  NORVASC     fish oil-omega-3 fatty acids 1000 MG capsule     losartan 100 MG tablet  Commonly known as:  COZAAR     MULTIPLE VITAMIN PO     MYRBETRIQ 25 MG Tb24 tablet  Generic drug:  mirabegron ER      TAKE these medications        acetaminophen 500 MG tablet  Commonly known as:  TYLENOL  Take 500 mg by mouth every 6 (six) hours as needed for mild pain.     aspirin 81 MG tablet  Take 81 mg by mouth daily.     brimonidine-timolol 0.2-0.5 % ophthalmic solution  Commonly known as:  COMBIGAN  Place 1 drop into both eyes every 12 (twelve) hours.     carvedilol 6.25 MG tablet  Commonly known as:  COREG  Take 1 tablet (6.25 mg total) by mouth 2 (two) times daily.     furosemide 20 MG tablet  Commonly known as:  LASIX  Take 1 tablet (20 mg total) by mouth daily.     GNP CALCIUM PLUS 600 +D 600-200 MG-UNIT Tabs  Take 1 tablet by mouth daily.      latanoprost 0.005 % ophthalmic solution  Commonly known as:  XALATAN  Place 1 drop into both eyes at bedtime.     Tiotropium Bromide Monohydrate 2.5 MCG/ACT Aers  Commonly known as:  SPIRIVA RESPIMAT  Inhale 2 puffs into the lungs daily.     triamcinolone 0.025 % cream  Commonly known as:  KENALOG  Apply 1 application topically daily.     vitamin C 500 MG tablet  Commonly known as:  ASCORBIC ACID  Take 500 mg by mouth daily.           Follow-up Information  Follow up with [facility].   Specialty:  [place] information:   [address] [place] [phone]      Follow up with Mathews Argyle, MD In 1 week.   Specialty:  Internal Medicine   Contact information:   [address] [place] [address] [phone]        Time coordinating discharge: 35 min  Signed:  JESSICA Alison Stalling   [facility] 10/25/2015, 12:53 PM

## 2015-10-26 ENCOUNTER — Inpatient Hospital Stay: Admission: RE | Admit: 2015-10-26 | Payer: Medicare Other | Source: Ambulatory Visit

## 2015-10-26 ENCOUNTER — Ambulatory Visit: Payer: Medicare Other | Admitting: Cardiology

## 2015-10-28 ENCOUNTER — Ambulatory Visit (INDEPENDENT_AMBULATORY_CARE_PROVIDER_SITE_OTHER)
Admission: RE | Admit: 2015-10-28 | Discharge: 2015-10-28 | Disposition: A | Discharge status: Home / Self Care | Payer: Medicare Other | Source: Ambulatory Visit | Attending: Pulmonary Disease | Admitting: Pulmonary Disease

## 2015-10-28 DIAGNOSIS — R911 Solitary pulmonary nodule: Secondary | ICD-10-CM | POA: Insufficient documentation

## 2015-10-28 DIAGNOSIS — J9621 Acute and chronic respiratory failure with hypoxia: Secondary | ICD-10-CM

## 2015-10-28 DIAGNOSIS — J984 Other disorders of lung: Secondary | ICD-10-CM

## 2015-10-28 DIAGNOSIS — J9611 Chronic respiratory failure with hypoxia: Secondary | ICD-10-CM

## 2015-10-29 ENCOUNTER — Telehealth: Payer: Self-pay | Admitting: Pulmonary Disease

## 2015-10-29 ENCOUNTER — Other Ambulatory Visit: Payer: Self-pay

## 2015-10-29 DIAGNOSIS — R911 Solitary pulmonary nodule: Secondary | ICD-10-CM

## 2015-10-29 NOTE — Telephone Encounter (Signed)
Spoke with [facility]. She states that they have been unable to route pt's O2 through his CPAP mask. Advised her to call [facility] to get instructions on how to do this. She will call back if we need to order new mask for CPAP. Nothing further needed.

## 2015-11-01 ENCOUNTER — Emergency Department (HOSPITAL_COMMUNITY): Payer: Medicare Other

## 2015-11-01 ENCOUNTER — Encounter (HOSPITAL_COMMUNITY): Payer: Self-pay | Admitting: Family Medicine

## 2015-11-01 ENCOUNTER — Emergency Department (HOSPITAL_COMMUNITY)
Admission: EM | Admit: 2015-11-01 | Discharge: 2015-11-04 | Disposition: E | Payer: Medicare Other | Attending: Emergency Medicine | Admitting: Emergency Medicine

## 2015-11-01 DIAGNOSIS — R4182 Altered mental status, unspecified: Secondary | ICD-10-CM | POA: Diagnosis present

## 2015-11-01 DIAGNOSIS — Z8673 Personal history of transient ischemic attack (TIA), and cerebral infarction without residual deficits: Secondary | ICD-10-CM | POA: Insufficient documentation

## 2015-11-01 DIAGNOSIS — R011 Cardiac murmur, unspecified: Secondary | ICD-10-CM | POA: Diagnosis not present

## 2015-11-01 DIAGNOSIS — Z7982 Long term (current) use of aspirin: Secondary | ICD-10-CM | POA: Insufficient documentation

## 2015-11-01 DIAGNOSIS — Z515 Encounter for palliative care: Secondary | ICD-10-CM | POA: Diagnosis not present

## 2015-11-01 DIAGNOSIS — M1990 Unspecified osteoarthritis, unspecified site: Secondary | ICD-10-CM | POA: Insufficient documentation

## 2015-11-01 DIAGNOSIS — Z85820 Personal history of malignant melanoma of skin: Secondary | ICD-10-CM | POA: Diagnosis not present

## 2015-11-01 DIAGNOSIS — I610 Nontraumatic intracerebral hemorrhage in hemisphere, subcortical: Secondary | ICD-10-CM | POA: Insufficient documentation

## 2015-11-01 DIAGNOSIS — Z9981 Dependence on supplemental oxygen: Secondary | ICD-10-CM | POA: Diagnosis not present

## 2015-11-01 DIAGNOSIS — Z87891 Personal history of nicotine dependence: Secondary | ICD-10-CM | POA: Diagnosis not present

## 2015-11-01 DIAGNOSIS — G8929 Other chronic pain: Secondary | ICD-10-CM | POA: Diagnosis not present

## 2015-11-01 DIAGNOSIS — I10 Essential (primary) hypertension: Secondary | ICD-10-CM | POA: Diagnosis not present

## 2015-11-01 DIAGNOSIS — G4733 Obstructive sleep apnea (adult) (pediatric): Secondary | ICD-10-CM | POA: Insufficient documentation

## 2015-11-01 DIAGNOSIS — E785 Hyperlipidemia, unspecified: Secondary | ICD-10-CM | POA: Insufficient documentation

## 2015-11-01 DIAGNOSIS — R29810 Facial weakness: Secondary | ICD-10-CM | POA: Insufficient documentation

## 2015-11-01 DIAGNOSIS — I619 Nontraumatic intracerebral hemorrhage, unspecified: Secondary | ICD-10-CM

## 2015-11-01 DIAGNOSIS — M419 Scoliosis, unspecified: Secondary | ICD-10-CM | POA: Insufficient documentation

## 2015-11-01 DIAGNOSIS — Z87438 Personal history of other diseases of male genital organs: Secondary | ICD-10-CM | POA: Insufficient documentation

## 2015-11-01 DIAGNOSIS — Z7952 Long term (current) use of systemic steroids: Secondary | ICD-10-CM | POA: Diagnosis not present

## 2015-11-01 DIAGNOSIS — Z79899 Other long term (current) drug therapy: Secondary | ICD-10-CM | POA: Diagnosis not present

## 2015-11-01 LAB — I-STAT CHEM 8, ED
BUN: 31 mg/dL — ABNORMAL HIGH (ref 6–20)
Calcium, Ion: 1.07 mmol/L — ABNORMAL LOW (ref 1.13–1.30)
Chloride: 94 mmol/L — ABNORMAL LOW (ref 101–111)
Creatinine, Ser: 0.6 mg/dL — ABNORMAL LOW (ref 0.61–1.24)
Glucose, Bld: 105 mg/dL — ABNORMAL HIGH (ref 65–99)
HCT: 41 % (ref 39.0–52.0)
Hemoglobin: 13.9 g/dL (ref 13.0–17.0)
Potassium: 4.2 mmol/L (ref 3.5–5.1)
Sodium: 140 mmol/L (ref 135–145)
TCO2: 37 mmol/L (ref 0–100)

## 2015-11-01 LAB — DIFFERENTIAL
Basophils Absolute: 0 10*3/uL (ref 0.0–0.1)
Basophils Relative: 1 %
Eosinophils Absolute: 0.2 10*3/uL (ref 0.0–0.7)
Eosinophils Relative: 4 %
Lymphocytes Relative: 13 %
Lymphs Abs: 0.8 10*3/uL (ref 0.7–4.0)
Monocytes Absolute: 0.6 10*3/uL (ref 0.1–1.0)
Monocytes Relative: 10 %
Neutro Abs: 4.4 10*3/uL (ref 1.7–7.7)
Neutrophils Relative %: 72 %

## 2015-11-01 LAB — CBC
HCT: 41.9 % (ref 39.0–52.0)
Hemoglobin: 13.4 g/dL (ref 13.0–17.0)
MCH: 31.3 pg (ref 26.0–34.0)
MCHC: 32 g/dL (ref 30.0–36.0)
MCV: 97.9 fL (ref 78.0–100.0)
PLATELETS: 180 10*3/uL (ref 150–400)
RBC: 4.28 MIL/uL (ref 4.22–5.81)
RDW: 13 % (ref 11.5–15.5)
WBC: 6 10*3/uL (ref 4.0–10.5)

## 2015-11-01 LAB — COMPREHENSIVE METABOLIC PANEL
ALBUMIN: 3.4 g/dL — AB (ref 3.5–5.0)
ALT: 35 U/L (ref 17–63)
ANION GAP: 4 — AB (ref 5–15)
AST: 28 U/L (ref 15–41)
Alkaline Phosphatase: 44 U/L (ref 38–126)
BUN: 29 mg/dL — ABNORMAL HIGH (ref 6–20)
CHLORIDE: 98 mmol/L — AB (ref 101–111)
CO2: 38 mmol/L — AB (ref 22–32)
CREATININE: 0.66 mg/dL (ref 0.61–1.24)
Calcium: 9.3 mg/dL (ref 8.9–10.3)
GFR calc non Af Amer: >60 mL/min (ref >60)
Glucose, Bld: 107 mg/dL — ABNORMAL HIGH (ref 65–99)
Potassium: 4.4 mmol/L (ref 3.5–5.1)
SODIUM: 140 mmol/L (ref 135–145)
Total Bilirubin: 0.9 mg/dL (ref 0.3–1.2)
Total Protein: 6.4 g/dL — ABNORMAL LOW (ref 6.5–8.1)

## 2015-11-01 LAB — PROTIME-INR
INR: 1.17 (ref 0.00–1.49)
PROTHROMBIN TIME: 15.1 s (ref 11.6–15.2)

## 2015-11-01 LAB — I-STAT TROPONIN, ED: Troponin i, poc: 0 ng/mL (ref 0.00–0.08)

## 2015-11-01 LAB — APTT: aPTT: 22 seconds — ABNORMAL LOW (ref 24–37)

## 2015-11-01 MED ORDER — ACETAMINOPHEN 650 MG RE SUPP: 650.0000 mg | Freq: Four times a day (QID) | RECTAL | Status: DC | PRN

## 2015-11-01 MED ORDER — LORAZEPAM 1 MG PO TABS: 1.0000 mg | ORAL_TABLET | ORAL | Status: DC | PRN

## 2015-11-01 MED ORDER — DIPHENHYDRAMINE HCL 50 MG/ML IJ SOLN: 12.5000 mg | INTRAMUSCULAR | Status: DC | PRN

## 2015-11-01 MED ORDER — LORAZEPAM 2 MG/ML IJ SOLN: 1.0000 mg | INTRAMUSCULAR | Status: DC | PRN

## 2015-11-01 MED ORDER — SODIUM CHLORIDE 0.9% FLUSH: 3.0000 mL | Freq: Two times a day (BID) | INTRAVENOUS | Status: DC

## 2015-11-01 MED ORDER — ONDANSETRON HCL 4 MG/2ML IJ SOLN: 4.0000 mg | Freq: Four times a day (QID) | INTRAMUSCULAR | Status: DC | PRN

## 2015-11-01 MED ORDER — HYDRALAZINE HCL 20 MG/ML IJ SOLN
10.0000 mg | Freq: Once | INTRAMUSCULAR | Status: DC
  Administered 2015-11-01: 10 mg via INTRAVENOUS
  Filled 2015-11-01: qty 1

## 2015-11-01 MED ORDER — MORPHINE SULFATE 25 MG/ML IV SOLN
5.0000 mg/h | INTRAVENOUS | Status: DC
  Filled 2015-11-01: qty 10

## 2015-11-01 MED ORDER — HALOPERIDOL LACTATE 2 MG/ML PO CONC
0.5000 mg | ORAL | Status: DC | PRN
  Filled 2015-11-01: qty 0.3

## 2015-11-01 MED ORDER — POLYVINYL ALCOHOL 1.4 % OP SOLN
1.0000 [drp] | Freq: Four times a day (QID) | OPHTHALMIC | Status: DC | PRN
  Filled 2015-11-01: qty 15

## 2015-11-01 MED ORDER — ACETAMINOPHEN 325 MG PO TABS: 650.0000 mg | ORAL_TABLET | Freq: Four times a day (QID) | ORAL | Status: DC | PRN

## 2015-11-01 MED ORDER — SODIUM CHLORIDE 0.9 % IV SOLN: 250.0000 mL | INTRAVENOUS | Status: DC | PRN

## 2015-11-01 MED ORDER — TRAZODONE HCL 50 MG PO TABS: 25.0000 mg | ORAL_TABLET | Freq: Every evening | ORAL | Status: DC | PRN

## 2015-11-01 MED ORDER — GLYCOPYRROLATE 0.2 MG/ML IJ SOLN: 0.2000 mg | INTRAMUSCULAR | Status: DC | PRN

## 2015-11-01 MED ORDER — BIOTENE DRY MOUTH MT LIQD: 15.0000 mL | OROMUCOSAL | Status: DC | PRN

## 2015-11-01 MED ORDER — SODIUM CHLORIDE 0.9% FLUSH: 3.0000 mL | INTRAVENOUS | Status: DC | PRN

## 2015-11-01 MED ORDER — SODIUM CHLORIDE 0.9 % IV SOLN: 12.5000 mg | Freq: Four times a day (QID) | INTRAVENOUS | Status: DC | PRN

## 2015-11-01 MED ORDER — HALOPERIDOL LACTATE 5 MG/ML IJ SOLN: 0.5000 mg | INTRAMUSCULAR | Status: DC | PRN

## 2015-11-01 MED ORDER — HALOPERIDOL 1 MG PO TABS: 0.5000 mg | ORAL_TABLET | ORAL | Status: DC | PRN

## 2015-11-01 MED ORDER — MORPHINE SULFATE (PF) 4 MG/ML IV SOLN: 4.0000 mg | Freq: Once | INTRAVENOUS | Status: DC

## 2015-11-01 MED ORDER — GLYCOPYRROLATE 1 MG PO TABS
1.0000 mg | ORAL_TABLET | ORAL | Status: DC | PRN
  Filled 2015-11-01: qty 1

## 2015-11-01 MED ORDER — MORPHINE SULFATE (PF) 2 MG/ML IV SOLN
2.0000 mg | INTRAVENOUS | Status: DC | PRN
  Filled 2015-11-01 (×2): qty 1

## 2015-11-01 MED ORDER — LORAZEPAM 2 MG/ML PO CONC: 1.0000 mg | ORAL | Status: DC | PRN

## 2015-11-01 MED ORDER — MORPHINE BOLUS VIA INFUSION
2.0000 mg | INTRAVENOUS | Status: DC | PRN
  Filled 2015-11-01: qty 4

## 2015-11-01 MED ORDER — ONDANSETRON 4 MG PO TBDP: 4.0000 mg | ORAL_TABLET | Freq: Four times a day (QID) | ORAL | Status: DC | PRN

## 2015-11-01 MED ORDER — OCTREOTIDE ACETATE 100 MCG/ML IJ SOLN
100.0000 ug | Freq: Three times a day (TID) | INTRAMUSCULAR | Status: DC | PRN
  Filled 2015-11-01: qty 1

## 2015-11-01 MED ORDER — ONDANSETRON HCL 4 MG/2ML IJ SOLN
4.0000 mg | Freq: Once | INTRAMUSCULAR | Status: DC
  Administered 2015-11-01: 4 mg via INTRAVENOUS
  Filled 2015-11-01: qty 2

## 2015-11-04 NOTE — Discharge Summary (Signed)
Physician Deah Summary  Pedro Hall. TQ:[id number] DOB: 1923/06/23 DOA: 2015-11-14  PCP: Mathews Argyle, MD  Admit date: November 14, 2015 Discharge date: 2015/11/14   Discharge Diagnoses:  Active Problems:   Essential hypertension   Stroke, hemorrhagic ([place])   End of life care   Acute hemorrhagic Stroke preceded by Right sided weakness CT head without contrast on 14-Nov-2022 showed Acute large intraparenchymal hemorrhage centered in the left posterior basal ganglia with extension to the left thalamus, large volume intraventricular hemorrhage , Mild dilatation of the temporal horns of the lateral ventricles bilaterally and Right midline shift measuring 5 mm  Due to the irreversibility of this findings and after discussion with the family, no heroic measures were taken.   End of life Care   He was admitted to Med Surg -End-of-life protocol -Morphine drip, benzos  Robinul for excessive secretions Zofran for Nausea -discontinue home medications O2 [place] for comfort -Palliative care consult was to be requested  Patient died on November 14, 2022. Death confirmed by EDP and Nurse. Chaplain provided spiritual comfort to the family.    Hypertension His BP was to be expected to increased due to Intracranial Hemorrhage. However, after discussion with family, Antihypertensives were not to be initiatied due to the irreversibility of his clinical status.   Filed Weights   2015/11/14 1224  Weight: 63.957 kg (141 lb)    History of present illness: is a 82 y.o. male with medical history significant for HTN, HLD,OSA on CPAP and on home O2, recently discharged from the hospital (5/9-5/22) for Stroke Like episode, brought to the ED on Nov 14, 2022  by EMS from [place] with acute onset of right sided weakness complicated with aphasia. At the time, he was following commands. Last known normal last evening according to his family who is providing the history. He was taken to CT around 9:20 am, at which time he was  less responsive. CT head without contrast showed acute large intraparenchymal hemorrhage centered in the left posterior basal ganglia with extension to the left thalamus with large volume intraventricular hemorrhage, mild dilatation of the temporal horns of the lateral ventricles bilaterally and right midline shift measuring 5 mm. No apparent trauma. He was on ASA but no other blood thinners. Per family, he did not report any respiratory, cardiac or GI complaints prior to admission. He was interactive, and was not reporting headaches of vision changes until last night. However, today, he became  unresponsive to touch and to verbal commands. The findings and the current clinical condition and prognosis were discussed with the family, who wanted to respect the patient's wishes of not proceeding with heroic measures and as such, he was placed DNR.   Hospital Course:  He was admitted for end of life care to [facility]. He was non responsive to voice and touch or painful stimuli. No apparent distress was noted at the time. His exam was remarkable for increased crackles on auscultation with the use of accesory muscles. End-of-life protocol was initiated, including Morphine drip, Robinul for excessive secretions, Zofran for Nausea, and Oxygen for comfort. Home meds were discontinued  Palliative care consult was to be placed.  Patient was pronounced dead after evaluation by Dr. Tanna Furry, EDP and RN. Spiritual Support was provided to the family by [facility]. PLease see further details by EDP note.    Procedures:  CT head 11/14/15 without contrast  showed acute large intraparenchymal hemorrhage centered in the left posterior basal ganglia with extension to the left thalamus with large volume intraventricular hemorrhage,  mild dilatation of the temporal horns of the lateral ventricles bilaterally and right midline shift measuring 5 mm. No apparent trauma.     Chest X ray 2015-11-07  No acute cardiopulmonary  disease.  Consultations:  None  Discharge Exam: Filed Vitals:   November 07, 2015 1213 07-Nov-2015 1215  BP:  0/0  Pulse:  0  Temp:    Resp: 0    Meds at the time of death  Scheduled Meds: . sodium chloride flush  3 mL Intravenous Q12H   Continuous Infusions: . sodium chloride    . morphine     PRN Meds:.sodium chloride, acetaminophen **OR** acetaminophen, antiseptic oral rinse, diphenhydrAMINE, glycopyrrolate **OR** glycopyrrolate **OR** glycopyrrolate, haloperidol **OR** haloperidol **OR** haloperidol lactate, LORazepam **OR** LORazepam **OR** LORazepam, morphine, octreotide, ondansetron **OR** ondansetron (ZOFRAN) IV, polyvinyl alcohol, sodium chloride flush, traZODone No Known Allergies    Significant Diagnostic Studies:  Ct Head Wo Contrast  11-07-15  CLINICAL DATA:  Right upper extremity weakness. EXAM: CT HEAD WITHOUT CONTRAST TECHNIQUE: Contiguous axial images were obtained from the base of the skull through the vertex without intravenous contrast. COMPARISON:  12/22/2015 head CT and brain MRI. FINDINGS: There is an acute hyperdense 4.5 x 3.9 cm intraparenchymal hemorrhage centered in the left posterior basal ganglia with extension into the left thalamus. There is large volume intraventricular hemorrhage filling the entire left lateral ventricle, third ventricle and frontal horn/ anterior body of the right lateral ventricle. There is intraventricular hemorrhage in the cerebral aqueduct and fourth ventricle. There is 5 mm right midline shift. Basilar cisterns remain patent. Mild dilatation of the temporal horns of the lateral ventricles bilaterally. No extra-axial collections. Intracranial atherosclerosis. Nonspecific mild-to-moderate subcortical and periventricular white matter hypodensity, most in keeping with chronic small vessel ischemic change. Complete opacification of the left maxillary sinus, unchanged. Partial opacification of the bilateral ethmoidal air cells, not appreciably  changed. The mastoid air cells are unopacified. No evidence of calvarial fracture. IMPRESSION: 1. Acute large intraparenchymal hemorrhage centered in the left posterior basal ganglia with extension to the left thalamus. 2. Large volume intraventricular hemorrhage as described. Mild dilatation of the temporal horns of the lateral ventricles bilaterally. 3. Right midline shift measuring 5 mm. 4. Underlying chronic small vessel ischemia. 5. Chronic paranasal sinusitis. Critical Value/emergent results were called by telephone at the time of interpretation on Nov 07, 2015 at 9:21 am to Dr. Tanna Furry , who verbally acknowledged these results. Electronically Signed   By: Ilona Sorrel M.D.   On: 2015/11/07 09:26   Dg Chest Portable 1 View  11/07/15  CLINICAL DATA:  Right-sided weakness.  Hypertension.  Heart murmur. EXAM: PORTABLE CHEST 1 VIEW COMPARISON:  CT of 10/28/2015.  Plain film of 10/22/2015. FINDINGS: Patient rotated right. Cardiomegaly accentuated by AP portable technique. Atherosclerosis in the transverse aorta. Moderate left hemidiaphragm elevation. No pleural effusion or pneumothorax. Poor inspiratory effort with bibasilar atelectasis. No congestive failure. IMPRESSION: No acute cardiopulmonary disease. Cardiomegaly without congestive failure. Low lung volumes. Electronically Signed   By: Abigail Miyamoto M.D.   On: 2015-11-07 09:53    Microbiology: No results found for this or any previous visit (from the past 240 hour(s)).   Labs: Basic Metabolic Panel:  Recent Labs Lab 2015/11/07 0840 11/07/2015 0858  NA 140 140  K 4.4 4.2  CL 98* 94*  CO2 38*  --   GLUCOSE 107* 105*  BUN 29* 31*  CREATININE 0.66 0.60*  CALCIUM 9.3  --    Liver Function Tests:  Recent Labs Lab Nov 07, 2015 0840  AST 28  ALT 35  ALKPHOS 44  BILITOT 0.9  PROT 6.4*  ALBUMIN 3.4*   No results for input(s): LIPASE, AMYLASE in the last 168 hours. No results for input(s): AMMONIA in the last 168 hours. CBC:  Recent  Labs Lab 11-13-15 0840 13-Nov-2015 0858  WBC 6.0  --   NEUTROABS 4.4  --   HGB 13.4 13.9  HCT 41.9 41.0  MCV 97.9  --   PLT 180  --    Cardiac Enzymes: No results for input(s): CKTOTAL, CKMB, CKMBINDEX, TROPONINI in the last 168 hours. BNP: BNP (last 3 results)  Recent Labs  08/23/15 1243 09/21/15 1459  BNP 132.8* 171.0*    ProBNP (last 3 results) No results for input(s): PROBNP in the last 8760 hours.  CBG: No results for input(s): GLUCAP in the last 168 hours.     SignedRondel Jumbo [facility]  [facility] 11-13-15, 1:06 PM

## 2015-11-04 NOTE — Progress Notes (Signed)
Chaplain responded to request by ED to provide spiritual care support.  Upon arrival to the room the son was outside of the patient's room and informed the Chaplain that his father had died. Condolences and comfort measures were extended to the son, and he informed Chaplain that his Pedro Hall was in route, and there was not a need at this time for Chaplain, but they were appreciative of the support. Chaplain Yaakov Guthrie [phone]

## 2015-11-04 NOTE — ED Notes (Signed)
This RN returned pt to room from [facility].

## 2015-11-04 NOTE — ED Notes (Signed)
Tranported pt to morgue. Death Certificate placed with patient. Bed control updated, family updated.

## 2015-11-04 NOTE — H&P (Signed)
History and Physical    Pedro Hall. TQ:[id number] DOB: 10/08/1923 DOA: 2015/11/11   PCP: Mathews Argyle, MD   Patient coming from:  SNF   Chief Complaint: End of Life Issues   HPI: Pedro Hall. is a 82 y.o. male with medical history significant for HTN, HLD,OSA on CPAP and on home O2, recently discharged from the hospital  (5/9-5/22) for Stroke Like episode, now brought  to the ED by EMS from [place]  with acute onset of right sided weakness complicated with aphasia. At the time, he was following commands.  Last known normal last evening according to his family who is providing the history. He was taken to CT around 9:20 am, at which time he was less responsive. THis CT head without contrast showed  acute large intraparenchymal hemorrhage centered in the left posterior basal ganglia with extension to the left thalamus with  large volume intraventricular hemorrhage, mild dilatation of the temporal horns of the lateral ventricles bilaterally and right midline shift measuring 5 mm. No apparent trauma. He was on ASA but no other blood thinners. Per family, he did not report any respiratory, cardiac or GI complaints prior to admission. He was interactive, and was not reporting headaches of vision changes at the time. However, today, at the time of the visit, patient is now not interactive, unresponsive to touch and to verbal commands.  The findings and the current clinical condition and prognosis  were discussed with the family, who want to respect the patient's wishes, thus he is being placed DNR. He is being admitted for end of life care.    ED Course:  BP 0/0 mmHg  Pulse 0  Temp(Src) 97.7 F (36.5 C) (Oral)  Resp 15  SpO2 0%    Review of Systems: Unable to obtain due to patient's unresponsiveness.   Past Medical History  Diagnosis Date  . Hypertension   . Stenosis of right subclavian artery   . BPH (benign prostatic hypertrophy)   . Arrhythmia   .  Diverticulosis   . Severe scoliosis   . Bilateral carotid artery stenosis 02/2008  . Hemorrhoids   . Sleep disorder   . Essential hypertension 06/14/2015  . Hyperlipidemia 06/14/2015  . Subclavian artery stenosis, right 06/14/2015  . Scoliosis 06/14/2015  . Pulmonary hypertension ([place]) 07/04/2015  . Melanoma ([place])   . Basal cell carcinoma ">100"    "arms, ears, face; mayby the back"  . Squamous carcinoma ([place]) ">100"    "arms, ears, face; mayby the back"  . Heart murmur dx'd ~ 08/2015  . OSA on CPAP   . On home oxygen therapy     "3L; suppose to wear it when he exerts himself; not doing that right now" (10/22/2015)  . Sinus headache   . Arthritis     "hands" (10/22/2015)  . Chronic lower back pain     Past Surgical History  Procedure Laterality Date  . Hemorrhoid surgery  09/10/12  . Tee without cardioversion N/A 07/13/2015    Procedure: TRANSESOPHAGEAL ECHOCARDIOGRAM (TEE);  Surgeon: [name], MD;  Location: [facility] ENDOSCOPY;  Service: Cardiovascular;  Laterality: N/A;  . Tonsillectomy    . Appendectomy    . Inguinal hernia repair Bilateral   . Cervical disc surgery    . Back surgery    . Skin cancer excision      "arms, ears, face; mayby the back"  . Cataract extraction, bilateral Bilateral 1990s     reports that he quit smoking  about 66 years ago. His smoking use included Cigarettes. He has a 10 pack-year smoking history. He has never used smokeless tobacco. He reports that he drinks about 1.8 oz of alcohol per week. He reports that he does not use illicit drugs. Walks unassisted  with cane  with walker Patient is on  wheelchair  No Known Allergies  Family History  Problem Relation Age of Onset  . Prostate cancer Father   . Diabetes Sister   . Alzheimer's disease Mother       Prior to Admission medications   Medication Sig Start Date End Date Taking? Authorizing Provider  acetaminophen (TYLENOL) 500 MG tablet Take 500 mg by mouth every 6 (six) hours as needed for mild  pain.   Yes Historical Provider, MD  aspirin 81 MG tablet Take 81 mg by mouth daily.   Yes Historical Provider, MD  brimonidine-timolol (COMBIGAN) 0.2-0.5 % ophthalmic solution Place 1 drop into both eyes every 12 (twelve) hours.   Yes Historical Provider, MD  Calcium Carbonate-Vit D-Min (GNP CALCIUM PLUS 600 +D) 600-200 MG-UNIT TABS Take 1 tablet by mouth daily.   Yes Historical Provider, MD  carvedilol (COREG) 6.25 MG tablet Take 1 tablet (6.25 mg total) by mouth 2 (two) times daily. 10/25/15  Yes Geradine Girt, DO  furosemide (LASIX) 20 MG tablet Take 1 tablet (20 mg total) by mouth daily. 08/23/15  Yes Larey Dresser, MD  latanoprost (XALATAN) 0.005 % ophthalmic solution Place 1 drop into both eyes at bedtime.  07/31/12  Yes Historical Provider, MD  Tiotropium Bromide Monohydrate (SPIRIVA RESPIMAT) 2.5 MCG/ACT AERS Inhale 2 puffs into the lungs daily. 10/21/15  Yes Juanito Doom, MD  triamcinolone (KENALOG) 0.025 % cream Apply 1 application topically daily.  06/15/15  Yes Historical Provider, MD  vitamin C (ASCORBIC ACID) 500 MG tablet Take 500 mg by mouth daily.   Yes Historical Provider, MD    Physical Exam:    Filed Vitals:   Nov 15, 2015 0915 11-15-2015 1005 11/15/2015 1020 11/15/2015 1215  BP: 207/96  227/107 0/0  Pulse: 69 76 64 0  Temp:      TempSrc:      Resp: 22 23 15    SpO2: 92% 92% 93% 0%      Constitutional: NAD, does not appear uncomfortable at this time. Eyes closed, unresponsive.   Filed Vitals:   2015/11/15 0915 Nov 15, 2015 1005 2015-11-15 1020 2015/11/15 1215  BP: 207/96  227/107 0/0  Pulse: 69 76 64 0  Temp:      TempSrc:      Resp: 22 23 15    SpO2: 92% 92% 93% 0%   Eyes: closed, unable to open on his own  ENMT: Mucous membranes are moist. Posterior pharynx clear of any exudate or lesions.edentulous Neck: normal, supple, no masses, no thyromegaly Respiratory Bibasilar crackles anteriorly without wheezing or rhonchi.Poor respiratory effort. Accessory muscle use noted.    Cardiovascular: Regular rate and rhythm, no murmurs / rubs / gallops. No extremity edema. 2+ pedal pulses. No carotid bruits.  Abdomen: no tenderness, no masses palpated. No hepatosplenomegaly. Bowel sounds positive.  Musculoskeletal: no clubbing / cyanosis. No joint deformity upper and lower extremities. Good ROM, no contractures. decreased muscle tone.  Skin: no rashes, lesions, ulcers. No induration Neurologic: Unable to asses, patient unable to follow commands, non responsive.  Psychiatric:  He is non interactive at this time.      Labs on Admission: I have personally reviewed following labs and imaging studies  CBC:  Recent  Labs Lab 2015-11-13 0840 11/13/15 0858  WBC 6.0  --   NEUTROABS 4.4  --   HGB 13.4 13.9  HCT 41.9 41.0  MCV 97.9  --   PLT 180  --     Basic Metabolic Panel:  Recent Labs Lab 2015/11/13 0840 2015/11/13 0858  NA 140 140  K 4.4 4.2  CL 98* 94*  CO2 38*  --   GLUCOSE 107* 105*  BUN 29* 31*  CREATININE 0.66 0.60*  CALCIUM 9.3  --     GFR: Estimated Creatinine Clearance: 55.6 mL/min (by C-G formula based on Cr of 0.6).  Liver Function Tests:  Recent Labs Lab 2015-11-13 0840  AST 28  ALT 35  ALKPHOS 44  BILITOT 0.9  PROT 6.4*  ALBUMIN 3.4*   No results for input(s): LIPASE, AMYLASE in the last 168 hours. No results for input(s): AMMONIA in the last 168 hours.  Coagulation Profile:  Recent Labs Lab 11-13-15 0840  INR 1.17      Radiological Exams on Admission: Ct Head Wo Contrast  2015/11/13  CLINICAL DATA:  Right upper extremity weakness. EXAM: CT HEAD WITHOUT CONTRAST TECHNIQUE: Contiguous axial images were obtained from the base of the skull through the vertex without intravenous contrast. COMPARISON:  12/22/2015 head CT and brain MRI. FINDINGS: There is an acute hyperdense 4.5 x 3.9 cm intraparenchymal hemorrhage centered in the left posterior basal ganglia with extension into the left thalamus. There is large volume  intraventricular hemorrhage filling the entire left lateral ventricle, third ventricle and frontal horn/ anterior body of the right lateral ventricle. There is intraventricular hemorrhage in the cerebral aqueduct and fourth ventricle. There is 5 mm right midline shift. Basilar cisterns remain patent. Mild dilatation of the temporal horns of the lateral ventricles bilaterally. No extra-axial collections. Intracranial atherosclerosis. Nonspecific mild-to-moderate subcortical and periventricular white matter hypodensity, most in keeping with chronic small vessel ischemic change. Complete opacification of the left maxillary sinus, unchanged. Partial opacification of the bilateral ethmoidal air cells, not appreciably changed. The mastoid air cells are unopacified. No evidence of calvarial fracture. IMPRESSION: 1. Acute large intraparenchymal hemorrhage centered in the left posterior basal ganglia with extension to the left thalamus. 2. Large volume intraventricular hemorrhage as described. Mild dilatation of the temporal horns of the lateral ventricles bilaterally. 3. Right midline shift measuring 5 mm. 4. Underlying chronic small vessel ischemia. 5. Chronic paranasal sinusitis. Critical Value/emergent results were called by telephone at the time of interpretation on 11-13-15 at 9:21 am to Dr. Tanna Furry , who verbally acknowledged these results. Electronically Signed   By: Ilona Sorrel M.D.   On: 11-13-2015 09:26   Dg Chest Portable 1 View  11/13/2015  CLINICAL DATA:  Right-sided weakness.  Hypertension.  Heart murmur. EXAM: PORTABLE CHEST 1 VIEW COMPARISON:  CT of 10/28/2015.  Plain film of 10/22/2015. FINDINGS: Patient rotated right. Cardiomegaly accentuated by AP portable technique. Atherosclerosis in the transverse aorta. Moderate left hemidiaphragm elevation. No pleural effusion or pneumothorax. Poor inspiratory effort with bibasilar atelectasis. No congestive failure. IMPRESSION: No acute cardiopulmonary  disease. Cardiomegaly without congestive failure. Low lung volumes. Electronically Signed   By: Abigail Miyamoto M.D.   On: 11/13/2015 09:53    EKG: Independently reviewed.  Assessment/Plan Active Problems:   Stroke, hemorrhagic ([facility])   End of life care   Acute hemorrhagic Stroke preceded by Right sided weakness  CT head without contrast on 5/29 showed Acute large intraparenchymal hemorrhage centered in the left posterior basal ganglia with extension to  the left thalamus, large volume intraventricular hemorrhage , Mild dilatation of the temporal horns of the lateral ventricles bilaterally and  Right midline shift measuring 5 mm  Due to the irreversibility of this findings and after discussion with the family, no heroic measures are to be taken. He is DNR. Will proceed with comfort care only   End of life Care  -Admit MedSurg for comfort care, 6 N -End-of-life protocol -Morphine drip, benzos  Robinul for excessive secretions Zofran for Nausea -discontinue home medications O2 [place] for comfort -Palliative care consult  Other medical issues are not to be addressed after discussion with the family and plans for comfort care only.   DVT prophylaxis:  None  Code Status:   DNR Family Communication:  Discussed with family Disposition Plan: Expect patient to die in hospital Consults called:    None Admission status: Inpatient  Medical floor    [facility] E, PA-C [facility]   If 7PM-7AM, please contact night-coverage [email or web address] Password [facility]  November 25, 2015, 12:25 PM

## 2015-11-04 NOTE — ED Provider Notes (Signed)
CSN: [id number]     Arrival date & time 11-20-15  [id number] History   First MD Initiated Contact with Patient Nov 20, 2015 0820     Chief Complaint  Patient presents with  . Altered Mental Status     HPI  Patient presents for evaluation for a change in mental status and difficulty speaking.  Patient was admitted to the hospital last week with some right-sided symptoms. Thought to perhaps be TIA less or minus a radial nerve palsy. Was discharged to a rehabilitation facility. He had entered the hospital after living independently at home.  He is on Plavix for his history of TIA.  He spent the day yesterday with family had been up and ambulatory and "having a good day". However, this morning he had difficulty speaking, he could not speak or move his right arm or leg.  His last time known normal was 2130 last night.  Past Medical History  Diagnosis Date  . Hypertension   . Stenosis of right subclavian artery   . BPH (benign prostatic hypertrophy)   . Arrhythmia   . Diverticulosis   . Severe scoliosis   . Bilateral carotid artery stenosis 02/2008  . Hemorrhoids   . Sleep disorder   . Essential hypertension 06/14/2015  . Hyperlipidemia 06/14/2015  . Subclavian artery stenosis, right 06/14/2015  . Scoliosis 06/14/2015  . Pulmonary hypertension ([place]) 07/04/2015  . Melanoma ([place])   . Basal cell carcinoma ">100"    "arms, ears, face; mayby the back"  . Squamous carcinoma ([place]) ">100"    "arms, ears, face; mayby the back"  . Heart murmur dx'd ~ 08/2015  . OSA on CPAP   . On home oxygen therapy     "3L; suppose to wear it when he exerts himself; not doing that right now" (10/22/2015)  . Sinus headache   . Arthritis     "hands" (10/22/2015)  . Chronic lower back pain    Past Surgical History  Procedure Laterality Date  . Hemorrhoid surgery  09/10/12  . Tee without cardioversion N/A 07/13/2015    Procedure: TRANSESOPHAGEAL ECHOCARDIOGRAM (TEE);  Surgeon: [name], MD;  Location: [facility] ENDOSCOPY;   Service: Cardiovascular;  Laterality: N/A;  . Tonsillectomy    . Appendectomy    . Inguinal hernia repair Bilateral   . Cervical disc surgery    . Back surgery    . Skin cancer excision      "arms, ears, face; mayby the back"  . Cataract extraction, bilateral Bilateral 1990s   Family History  Problem Relation Age of Onset  . Prostate cancer Father   . Diabetes Sister   . Alzheimer's disease Mother    Social History  Substance Use Topics  . Smoking status: Former Smoker -- 1.00 packs/day for 10 years    Types: Cigarettes    Quit date: 06/05/1949  . Smokeless tobacco: Never Used  . Alcohol Use: 1.8 oz/week    3 Glasses of wine per week    Review of Systems  Unable to perform ROS: Patient nonverbal      Allergies  Review of patient's allergies indicates no known allergies.  Home Medications   Prior to Admission medications   Medication Sig Start Date End Date Taking? Authorizing Provider  acetaminophen (TYLENOL) 500 MG tablet Take 500 mg by mouth every 6 (six) hours as needed for mild pain.   Yes Historical Provider, MD  aspirin 81 MG tablet Take 81 mg by mouth daily.   Yes Historical Provider, MD  brimonidine-timolol (  COMBIGAN) 0.2-0.5 % ophthalmic solution Place 1 drop into both eyes every 12 (twelve) hours.   Yes Historical Provider, MD  Calcium Carbonate-Vit D-Min (GNP CALCIUM PLUS 600 +D) 600-200 MG-UNIT TABS Take 1 tablet by mouth daily.   Yes Historical Provider, MD  carvedilol (COREG) 6.25 MG tablet Take 1 tablet (6.25 mg total) by mouth 2 (two) times daily. 10/25/15  Yes Geradine Girt, DO  furosemide (LASIX) 20 MG tablet Take 1 tablet (20 mg total) by mouth daily. 08/23/15  Yes Larey Dresser, MD  latanoprost (XALATAN) 0.005 % ophthalmic solution Place 1 drop into both eyes at bedtime.  07/31/12  Yes Historical Provider, MD  Tiotropium Bromide Monohydrate (SPIRIVA RESPIMAT) 2.5 MCG/ACT AERS Inhale 2 puffs into the lungs daily. 10/21/15  Yes Juanito Doom, MD   triamcinolone (KENALOG) 0.025 % cream Apply 1 application topically daily.  06/15/15  Yes Historical Provider, MD  vitamin C (ASCORBIC ACID) 500 MG tablet Take 500 mg by mouth daily.   Yes Historical Provider, MD   BP 0/0 mmHg  Pulse 0  Temp(Src) 97.7 F (36.5 C) (Oral)  Resp 0  Wt 141 lb (63.957 kg)  SpO2 0% Physical Exam  Constitutional: He appears well-developed and well-nourished. No distress.  HENT:  Head: Normocephalic.  Eyes: Conjunctivae are normal. Pupils are equal, round, and reactive to light. No scleral icterus.  Neck: Normal range of motion. Neck supple. No thyromegaly present.  Cardiovascular: Normal rate and regular rhythm.  Exam reveals no gallop and no friction rub.   No murmur heard. Pulmonary/Chest: Effort normal and breath sounds normal. No respiratory distress. He has no wheezes. He has no rales.  Abdominal: Soft. Bowel sounds are normal. He exhibits no distension. There is no tenderness. There is no rebound.  Musculoskeletal: Normal range of motion.  Neurological:  Patient has slight lower facial droop. He is nonverbal. His eyes are open and seems apparently alert. Flaccid right arm, flaccid right leg.  Skin: Skin is warm and dry. No rash noted.  Psychiatric: He has a normal mood and affect. His behavior is normal.    ED Course  Procedures (including critical care time) Labs Review Labs Reviewed  APTT - Abnormal; Notable for the following:    aPTT 22 (*)    All other components within normal limits  COMPREHENSIVE METABOLIC PANEL - Abnormal; Notable for the following:    Chloride 98 (*)    CO2 38 (*)    Glucose, Bld 107 (*)    BUN 29 (*)    Total Protein 6.4 (*)    Albumin 3.4 (*)    Anion gap 4 (*)    All other components within normal limits  I-STAT CHEM 8, ED - Abnormal; Notable for the following:    Chloride 94 (*)    BUN 31 (*)    Creatinine, Ser 0.60 (*)    Glucose, Bld 105 (*)    Calcium, Ion 1.07 (*)    All other components within normal  limits  PROTIME-INR  CBC  DIFFERENTIAL  I-STAT TROPOININ, ED    Imaging Review Ct Head Wo Contrast  Nov 29, 2015  CLINICAL DATA:  Right upper extremity weakness. EXAM: CT HEAD WITHOUT CONTRAST TECHNIQUE: Contiguous axial images were obtained from the base of the skull through the vertex without intravenous contrast. COMPARISON:  12/22/2015 head CT and brain MRI. FINDINGS: There is an acute hyperdense 4.5 x 3.9 cm intraparenchymal hemorrhage centered in the left posterior basal ganglia with extension into the left thalamus.  There is large volume intraventricular hemorrhage filling the entire left lateral ventricle, third ventricle and frontal horn/ anterior body of the right lateral ventricle. There is intraventricular hemorrhage in the cerebral aqueduct and fourth ventricle. There is 5 mm right midline shift. Basilar cisterns remain patent. Mild dilatation of the temporal horns of the lateral ventricles bilaterally. No extra-axial collections. Intracranial atherosclerosis. Nonspecific mild-to-moderate subcortical and periventricular white matter hypodensity, most in keeping with chronic small vessel ischemic change. Complete opacification of the left maxillary sinus, unchanged. Partial opacification of the bilateral ethmoidal air cells, not appreciably changed. The mastoid air cells are unopacified. No evidence of calvarial fracture. IMPRESSION: 1. Acute large intraparenchymal hemorrhage centered in the left posterior basal ganglia with extension to the left thalamus. 2. Large volume intraventricular hemorrhage as described. Mild dilatation of the temporal horns of the lateral ventricles bilaterally. 3. Right midline shift measuring 5 mm. 4. Underlying chronic small vessel ischemia. 5. Chronic paranasal sinusitis. Critical Value/emergent results were called by telephone at the time of interpretation on 2015/11/09 at 9:21 am to Dr. Tanna Furry , who verbally acknowledged these results. Electronically Signed    By: Ilona Sorrel M.D.   On: 11-09-2015 09:26   Dg Chest Portable 1 View  09-Nov-2015  CLINICAL DATA:  Right-sided weakness.  Hypertension.  Heart murmur. EXAM: PORTABLE CHEST 1 VIEW COMPARISON:  CT of 10/28/2015.  Plain film of 10/22/2015. FINDINGS: Patient rotated right. Cardiomegaly accentuated by AP portable technique. Atherosclerosis in the transverse aorta. Moderate left hemidiaphragm elevation. No pleural effusion or pneumothorax. Poor inspiratory effort with bibasilar atelectasis. No congestive failure. IMPRESSION: No acute cardiopulmonary disease. Cardiomegaly without congestive failure. Low lung volumes. Electronically Signed   By: Abigail Miyamoto M.D.   On: 11-09-15 09:53   I have personally reviewed and evaluated these images and lab results as part of my medical decision-making.   EKG Interpretation None      MDM   Final diagnoses:  Nontraumatic subcortical hemorrhage of left cerebral hemisphere [facility])   CT scan shows marked left intracerebral hemorrhage. 4 x 3 cm centered in the basal ganglia and into the thalamus. Has complete opacification of his left lateral ventricle with blood. He has greater than 5 mm of midline shift.  I discussed this at length with family. 4 brothers present with the patient. He has a DO NOT RESUSCITATE. They quickly revealed produce a living will. Patient has a very specific order living will that he does not want to be kept alive by any artificial means including chemical inhalation or tube feedings. Family is all on the same page that they wish to monitor this. I discussed the case briefly with neurosurgery.  Dr. Sherwood Gambler was kind enough to return my call immediately. He agreed that this was a nonsurgical condition. I discussed this again at length with the family. Recommended comfort measures. Patient does show signs of deterioration emergency room. I discussed the case with Dr. Leda Gauze internal medicine regarding palliative care for this patient. However,  he began to deteriorate here with becoming less responsive had increasing hypertension and slow worsening of bradycardia and expired in the emergency room at 12:15. I did witness this.  I made Linna Darner aware of this. I discussed the care with medical examiner. They've declined any additional needs. We have contacted the funeral home of the family's request. Family has spent a great deal of time with the patient before and after his passing.   Tanna Furry, MD 2015/11/09 [phone]

## 2015-11-04 NOTE — ED Notes (Signed)
Pt suctioned

## 2015-11-04 NOTE — ED Notes (Signed)
This RN transported pt to CT. 

## 2015-11-04 NOTE — ED Notes (Signed)
Pt presents from [facility] via [facility] w/ c/o right-sided deficits. LSN 2130 yesterday, symptoms discovered 0800 today by staff.  2 Sons at bedside and able to provide excellent history.  Pt unable to move RUE/RLE, is aphasic; pt is alert and following commands.

## 2015-11-04 DEATH — deceased

## 2015-11-11 NOTE — Telephone Encounter (Signed)
Spoke with son on 5/16 and 5/17 regarding the portable tanks.  He was going to talk with a family member to find out which portable tank would work better If wanted to change would call back.

## 2015-12-09 ENCOUNTER — Ambulatory Visit: Payer: Medicare Other | Admitting: Pulmonary Disease

## 2015-12-29 ENCOUNTER — Ambulatory Visit: Payer: Medicare Other | Admitting: Cardiovascular Disease

## 2016-09-10 IMAGING — NM NM PULMONARY VENT & PERF
16 series · 16 of 16 positions shown · non-contrast
Comparison: Chest radiograph 07/27/2015

CLINICAL DATA: Chronic pulmonary embolism. Hypertension. Carotid
artery stenosis.

EXAM:
NUCLEAR MEDICINE VENTILATION - PERFUSION LUNG SCAN
TECHNIQUE: Ventilation images were obtained in multiple projections using
inhaled aerosol Uc-QQm DTPA. Perfusion images were obtained in
multiple projections after intravenous injection of Uc-QQm MAA.
RADIOPHARMACEUTICALS:  Thirty-two Lechnetium-IIm DTPA aerosol
inhalation and 4.2 Lechnetium-IIm MAA IV

[Series 1: ant/post vent · 4.14mm/px · 1 of 1 slices shown (1 of 2)]
[im 1/1]
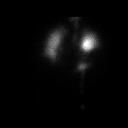

[Series 1: ant/post vent · 4.14mm/px · 1 of 1 slices shown (2 of 2)]
[im 1/1]
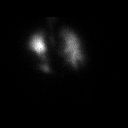

[Series 2: lao/rpo vent · 4.14mm/px · 1 of 1 slices shown (1 of 2)]
[im 1/1]
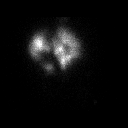

[Series 2: lao/rpo vent · 4.14mm/px · 1 of 1 slices shown (2 of 2)]
[im 1/1]
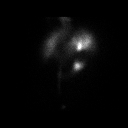

[Series 3: lpo/rao vent · 4.14mm/px · 1 of 1 slices shown (1 of 2)]
[im 1/1]
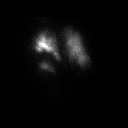

[Series 3: lpo/rao vent · 4.14mm/px · 1 of 1 slices shown (2 of 2)]
[im 1/1]
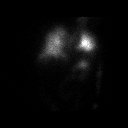

[Series 4: lt lat/rt lat vent · 4.14mm/px · 1 of 1 slices shown (1 of 2)]
[im 1/1]
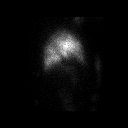

[Series 4: lt lat/rt lat vent · 4.14mm/px · 1 of 1 slices shown (2 of 2)]
[im 1/1]
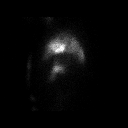

[Series 5: lt lat/rt lat perf · 4.14mm/px · 1 of 1 slices shown (1 of 2)]
[im 1/1]
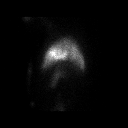

[Series 5: lt lat/rt lat perf · 4.14mm/px · 1 of 1 slices shown (2 of 2)]
[im 1/1]
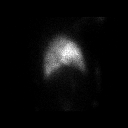

[Series 6: lpo/rao perf · 4.14mm/px · 1 of 1 slices shown (1 of 2)]
[im 1/1]
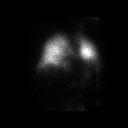

[Series 6: lpo/rao perf · 4.14mm/px · 1 of 1 slices shown (2 of 2)]
[im 1/1]
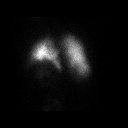

[Series 7: ant/post perf · 4.14mm/px · 1 of 1 slices shown (1 of 2)]
[im 1/1]
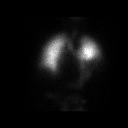

[Series 7: ant/post perf · 4.14mm/px · 1 of 1 slices shown (2 of 2)]
[im 1/1]
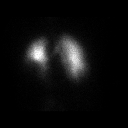

[Series 8: lao/rpo perf · 4.14mm/px · 1 of 1 slices shown (1 of 2)]
[im 1/1]
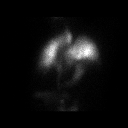

[Series 8: lao/rpo perf · 4.14mm/px · 1 of 1 slices shown (2 of 2)]
[im 1/1]
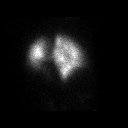

[16 of 16 positions shown; findings below may reference images not displayed]

FINDINGS: Ventilation: There is heterogeneous ventilation with poor
ventilation to the LEFT upper lobe.

Perfusion: The perfusion pattern is more evenly distributed than and
ventilation. No focal wedge-shaped peripheral perfusion defects to
suggest pulmonary embolism. The LEFT hemidiaphragm is chronically
elevated.
IMPRESSION: No evidence of pulmonary embolism.

Heterogeneous ventilation suggests COPD.
